# Patient Record
Sex: Female | Born: 1940
Health system: Southern US, Community
[De-identification: ages and names within clinical notes are randomized; demographics above are authoritative.]

## PROBLEM LIST (undated history)

## (undated) DIAGNOSIS — I498 Other specified cardiac arrhythmias: Secondary | ICD-10-CM

## (undated) DIAGNOSIS — Z87898 Personal history of other specified conditions: Secondary | ICD-10-CM

## (undated) DIAGNOSIS — M8580 Other specified disorders of bone density and structure, unspecified site: Secondary | ICD-10-CM

## (undated) DIAGNOSIS — Z8679 Personal history of other diseases of the circulatory system: Secondary | ICD-10-CM

## (undated) DIAGNOSIS — I499 Cardiac arrhythmia, unspecified: Secondary | ICD-10-CM

## (undated) DIAGNOSIS — E039 Hypothyroidism, unspecified: Secondary | ICD-10-CM

## (undated) DIAGNOSIS — R0989 Other specified symptoms and signs involving the circulatory and respiratory systems: Secondary | ICD-10-CM

## (undated) DIAGNOSIS — Z923 Personal history of irradiation: Secondary | ICD-10-CM

## (undated) DIAGNOSIS — I10 Essential (primary) hypertension: Secondary | ICD-10-CM

## (undated) DIAGNOSIS — C50919 Malignant neoplasm of unspecified site of unspecified female breast: Secondary | ICD-10-CM

## (undated) DIAGNOSIS — I341 Nonrheumatic mitral (valve) prolapse: Secondary | ICD-10-CM

## (undated) HISTORY — DX: Personal history of other diseases of the circulatory system: Z86.79

## (undated) HISTORY — DX: Essential (primary) hypertension: I10

## (undated) HISTORY — PX: HYSTEROSCOPY: SHX211

## (undated) HISTORY — DX: Other specified symptoms and signs involving the circulatory and respiratory systems: R09.89

## (undated) HISTORY — PX: OTHER SURGICAL HISTORY: SHX169

## (undated) HISTORY — DX: Hypothyroidism, unspecified: E03.9

## (undated) HISTORY — DX: Personal history of other specified conditions: Z87.898

## (undated) HISTORY — DX: Other specified cardiac arrhythmias: I49.8

## (undated) HISTORY — DX: Malignant neoplasm of unspecified site of unspecified female breast: C50.919

## (undated) HISTORY — DX: Nonrheumatic mitral (valve) prolapse: I34.1

## (undated) HISTORY — PX: EYE SURGERY: SHX253

## (undated) HISTORY — DX: Other specified disorders of bone density and structure, unspecified site: M85.80

---

## 1966-04-20 HISTORY — PX: PILONIDAL CYST / SINUS EXCISION: SUR543

## 1971-04-21 HISTORY — PX: HEMORROIDECTOMY: SUR656

## 1997-08-21 ENCOUNTER — Other Ambulatory Visit: Admission: RE | Admit: 1997-08-21 | Discharge: 1997-08-21 | Payer: Self-pay | Admitting: *Deleted

## 1997-08-31 ENCOUNTER — Ambulatory Visit (HOSPITAL_COMMUNITY): Admission: RE | Admit: 1997-08-31 | Discharge: 1997-08-31 | Payer: Self-pay | Admitting: Orthopedic Surgery

## 1997-11-14 ENCOUNTER — Other Ambulatory Visit: Admission: RE | Admit: 1997-11-14 | Discharge: 1997-11-14 | Payer: Self-pay | Admitting: *Deleted

## 1998-09-18 ENCOUNTER — Other Ambulatory Visit: Admission: RE | Admit: 1998-09-18 | Discharge: 1998-09-18 | Payer: Self-pay | Admitting: *Deleted

## 1999-04-21 HISTORY — PX: EXCISION MORTON'S NEUROMA: SHX5013

## 1999-04-30 ENCOUNTER — Other Ambulatory Visit: Admission: RE | Admit: 1999-04-30 | Discharge: 1999-04-30 | Payer: Self-pay | Admitting: Endocrinology

## 1999-09-29 ENCOUNTER — Other Ambulatory Visit: Admission: RE | Admit: 1999-09-29 | Discharge: 1999-09-29 | Payer: Self-pay | Admitting: *Deleted

## 1999-12-23 ENCOUNTER — Other Ambulatory Visit: Admission: RE | Admit: 1999-12-23 | Discharge: 1999-12-23 | Payer: Self-pay | Admitting: Podiatry

## 2000-04-20 HISTORY — PX: NEUROPLASTY / TRANSPOSITION MEDIAN NERVE AT CARPAL TUNNEL BILATERAL: SUR894

## 2000-06-25 ENCOUNTER — Encounter (INDEPENDENT_AMBULATORY_CARE_PROVIDER_SITE_OTHER): Payer: Self-pay

## 2000-06-25 ENCOUNTER — Other Ambulatory Visit: Admission: RE | Admit: 2000-06-25 | Discharge: 2000-06-25 | Payer: Self-pay | Admitting: *Deleted

## 2000-11-15 ENCOUNTER — Other Ambulatory Visit: Admission: RE | Admit: 2000-11-15 | Discharge: 2000-11-15 | Payer: Self-pay | Admitting: *Deleted

## 2001-10-26 ENCOUNTER — Other Ambulatory Visit: Admission: RE | Admit: 2001-10-26 | Discharge: 2001-10-26 | Payer: Self-pay | Admitting: *Deleted

## 2002-04-20 HISTORY — PX: THYROID LOBECTOMY: SHX420

## 2002-06-02 ENCOUNTER — Ambulatory Visit (HOSPITAL_BASED_OUTPATIENT_CLINIC_OR_DEPARTMENT_OTHER): Admission: RE | Admit: 2002-06-02 | Discharge: 2002-06-02 | Payer: Self-pay | Admitting: Orthopedic Surgery

## 2002-06-28 ENCOUNTER — Ambulatory Visit (HOSPITAL_BASED_OUTPATIENT_CLINIC_OR_DEPARTMENT_OTHER): Admission: RE | Admit: 2002-06-28 | Discharge: 2002-06-28 | Payer: Self-pay | Admitting: Orthopedic Surgery

## 2002-11-13 ENCOUNTER — Other Ambulatory Visit: Admission: RE | Admit: 2002-11-13 | Discharge: 2002-11-13 | Payer: Self-pay | Admitting: *Deleted

## 2003-01-01 ENCOUNTER — Other Ambulatory Visit: Admission: RE | Admit: 2003-01-01 | Discharge: 2003-01-01 | Payer: Self-pay | Admitting: Endocrinology

## 2003-03-06 ENCOUNTER — Encounter (INDEPENDENT_AMBULATORY_CARE_PROVIDER_SITE_OTHER): Payer: Self-pay

## 2003-03-06 ENCOUNTER — Inpatient Hospital Stay (HOSPITAL_COMMUNITY): Admission: RE | Admit: 2003-03-06 | Discharge: 2003-03-07 | Payer: Self-pay | Admitting: Surgery

## 2003-04-21 DIAGNOSIS — C50919 Malignant neoplasm of unspecified site of unspecified female breast: Secondary | ICD-10-CM

## 2003-04-21 HISTORY — DX: Malignant neoplasm of unspecified site of unspecified female breast: C50.919

## 2003-04-21 HISTORY — PX: BREAST LUMPECTOMY: SHX2

## 2003-04-21 HISTORY — PX: GANGLION CYST EXCISION: SHX1691

## 2003-12-17 ENCOUNTER — Other Ambulatory Visit: Admission: RE | Admit: 2003-12-17 | Discharge: 2003-12-17 | Payer: Self-pay | Admitting: *Deleted

## 2004-02-22 ENCOUNTER — Other Ambulatory Visit: Admission: RE | Admit: 2004-02-22 | Discharge: 2004-02-22 | Payer: Self-pay | Admitting: Surgery

## 2004-02-29 ENCOUNTER — Ambulatory Visit: Payer: Self-pay | Admitting: Oncology

## 2004-03-06 ENCOUNTER — Ambulatory Visit (HOSPITAL_COMMUNITY): Admission: RE | Admit: 2004-03-06 | Discharge: 2004-03-06 | Payer: Self-pay | Admitting: Orthopedic Surgery

## 2004-03-06 ENCOUNTER — Ambulatory Visit (HOSPITAL_BASED_OUTPATIENT_CLINIC_OR_DEPARTMENT_OTHER): Admission: RE | Admit: 2004-03-06 | Discharge: 2004-03-06 | Payer: Self-pay | Admitting: Orthopedic Surgery

## 2004-03-06 ENCOUNTER — Encounter (INDEPENDENT_AMBULATORY_CARE_PROVIDER_SITE_OTHER): Payer: Self-pay | Admitting: Specialist

## 2004-03-19 ENCOUNTER — Encounter (INDEPENDENT_AMBULATORY_CARE_PROVIDER_SITE_OTHER): Payer: Self-pay | Admitting: Specialist

## 2004-03-19 ENCOUNTER — Encounter (INDEPENDENT_AMBULATORY_CARE_PROVIDER_SITE_OTHER): Payer: Self-pay | Admitting: Surgery

## 2004-03-19 ENCOUNTER — Ambulatory Visit (HOSPITAL_COMMUNITY): Admission: RE | Admit: 2004-03-19 | Discharge: 2004-03-19 | Payer: Self-pay | Admitting: Surgery

## 2004-03-19 ENCOUNTER — Ambulatory Visit (HOSPITAL_BASED_OUTPATIENT_CLINIC_OR_DEPARTMENT_OTHER): Admission: RE | Admit: 2004-03-19 | Discharge: 2004-03-19 | Payer: Self-pay | Admitting: Surgery

## 2004-03-31 ENCOUNTER — Ambulatory Visit (HOSPITAL_COMMUNITY): Admission: RE | Admit: 2004-03-31 | Discharge: 2004-03-31 | Payer: Self-pay | Admitting: Oncology

## 2004-04-02 ENCOUNTER — Ambulatory Visit (HOSPITAL_COMMUNITY): Admission: RE | Admit: 2004-04-02 | Discharge: 2004-04-02 | Payer: Self-pay | Admitting: Oncology

## 2004-04-23 ENCOUNTER — Encounter: Admission: RE | Admit: 2004-04-23 | Discharge: 2004-04-23 | Payer: Self-pay | Admitting: Surgery

## 2004-04-24 ENCOUNTER — Encounter: Admission: RE | Admit: 2004-04-24 | Discharge: 2004-04-24 | Payer: Self-pay | Admitting: Surgery

## 2004-05-01 ENCOUNTER — Ambulatory Visit: Payer: Self-pay | Admitting: Oncology

## 2004-05-06 ENCOUNTER — Ambulatory Visit (HOSPITAL_COMMUNITY): Admission: RE | Admit: 2004-05-06 | Discharge: 2004-05-06 | Payer: Self-pay | Admitting: Oncology

## 2004-05-12 ENCOUNTER — Ambulatory Visit: Admission: RE | Admit: 2004-05-12 | Discharge: 2004-07-25 | Payer: Self-pay | Admitting: Radiation Oncology

## 2004-06-25 ENCOUNTER — Ambulatory Visit: Payer: Self-pay | Admitting: Oncology

## 2004-08-06 ENCOUNTER — Ambulatory Visit: Admission: RE | Admit: 2004-08-06 | Discharge: 2004-08-06 | Payer: Self-pay | Admitting: Radiation Oncology

## 2004-09-16 ENCOUNTER — Ambulatory Visit: Payer: Self-pay | Admitting: Internal Medicine

## 2004-09-19 ENCOUNTER — Ambulatory Visit: Payer: Self-pay | Admitting: Oncology

## 2004-11-19 ENCOUNTER — Ambulatory Visit: Payer: Self-pay | Admitting: Oncology

## 2004-12-24 ENCOUNTER — Encounter: Admission: RE | Admit: 2004-12-24 | Discharge: 2004-12-24 | Payer: Self-pay | Admitting: Oncology

## 2005-04-01 ENCOUNTER — Ambulatory Visit: Payer: Self-pay | Admitting: Oncology

## 2005-05-20 ENCOUNTER — Other Ambulatory Visit: Admission: RE | Admit: 2005-05-20 | Discharge: 2005-05-20 | Payer: Self-pay | Admitting: *Deleted

## 2005-07-29 ENCOUNTER — Ambulatory Visit: Payer: Self-pay | Admitting: Oncology

## 2005-10-12 ENCOUNTER — Ambulatory Visit: Payer: Self-pay | Admitting: Internal Medicine

## 2005-10-19 ENCOUNTER — Ambulatory Visit: Payer: Self-pay | Admitting: Internal Medicine

## 2005-11-24 ENCOUNTER — Ambulatory Visit: Payer: Self-pay | Admitting: Oncology

## 2005-11-26 LAB — CANCER ANTIGEN 27.29: CA 27.29: 12 U/mL (ref 0–39)

## 2005-11-26 LAB — COMPREHENSIVE METABOLIC PANEL
ALT: 20 U/L (ref 0–40)
CO2: 30 mEq/L (ref 19–32)
Calcium: 8.7 mg/dL (ref 8.4–10.5)
Chloride: 104 mEq/L (ref 96–112)
Sodium: 139 mEq/L (ref 135–145)
Total Protein: 6.7 g/dL (ref 6.0–8.3)

## 2005-11-26 LAB — CBC WITH DIFFERENTIAL/PLATELET
BASO%: 0.5 % (ref 0.0–2.0)
HCT: 37.2 % (ref 34.8–46.6)
MCHC: 34.4 g/dL (ref 32.0–36.0)
MONO#: 0.4 10*3/uL (ref 0.1–0.9)
RBC: 4.14 10*6/uL (ref 3.70–5.32)
WBC: 5.3 10*3/uL (ref 3.9–10.0)
lymph#: 0.9 10*3/uL (ref 0.9–3.3)

## 2005-11-26 LAB — LACTATE DEHYDROGENASE: LDH: 149 U/L (ref 94–250)

## 2005-12-28 ENCOUNTER — Encounter: Payer: Self-pay | Admitting: Internal Medicine

## 2005-12-28 ENCOUNTER — Encounter: Admission: RE | Admit: 2005-12-28 | Discharge: 2005-12-28 | Payer: Self-pay | Admitting: Oncology

## 2006-04-27 ENCOUNTER — Encounter: Admission: RE | Admit: 2006-04-27 | Discharge: 2006-04-27 | Payer: Self-pay | Admitting: Oncology

## 2006-05-26 ENCOUNTER — Ambulatory Visit: Payer: Self-pay | Admitting: Oncology

## 2006-05-28 LAB — COMPREHENSIVE METABOLIC PANEL
ALT: 19 U/L (ref 0–35)
AST: 21 U/L (ref 0–37)
Alkaline Phosphatase: 71 U/L (ref 39–117)
BUN: 17 mg/dL (ref 6–23)
Calcium: 9.2 mg/dL (ref 8.4–10.5)
Creatinine, Ser: 0.66 mg/dL (ref 0.40–1.20)
Total Bilirubin: 0.7 mg/dL (ref 0.3–1.2)

## 2006-05-28 LAB — CBC WITH DIFFERENTIAL/PLATELET
BASO%: 0.7 % (ref 0.0–2.0)
Basophils Absolute: 0 10*3/uL (ref 0.0–0.1)
EOS%: 0.8 % (ref 0.0–7.0)
HCT: 38.2 % (ref 34.8–46.6)
HGB: 13.6 g/dL (ref 11.6–15.9)
MCH: 30.7 pg (ref 26.0–34.0)
MCHC: 35.6 g/dL (ref 32.0–36.0)
MCV: 86.3 fL (ref 81.0–101.0)
MONO%: 4 % (ref 0.0–13.0)
NEUT%: 75.6 % (ref 39.6–76.8)
lymph#: 1.1 10*3/uL (ref 0.9–3.3)

## 2006-08-09 ENCOUNTER — Ambulatory Visit: Payer: Self-pay | Admitting: Internal Medicine

## 2006-09-01 ENCOUNTER — Other Ambulatory Visit: Admission: RE | Admit: 2006-09-01 | Discharge: 2006-09-01 | Payer: Self-pay | Admitting: *Deleted

## 2006-11-23 ENCOUNTER — Ambulatory Visit: Payer: Self-pay | Admitting: Oncology

## 2006-11-26 LAB — CBC WITH DIFFERENTIAL/PLATELET
Basophils Absolute: 0 10*3/uL (ref 0.0–0.1)
EOS%: 1.2 % (ref 0.0–7.0)
HCT: 37.8 % (ref 34.8–46.6)
HGB: 13.3 g/dL (ref 11.6–15.9)
MCH: 31.1 pg (ref 26.0–34.0)
MCV: 88.1 fL (ref 81.0–101.0)
MONO%: 7.9 % (ref 0.0–13.0)
NEUT%: 63.4 % (ref 39.6–76.8)
Platelets: 270 10*3/uL (ref 145–400)

## 2006-11-29 ENCOUNTER — Encounter: Payer: Self-pay | Admitting: Internal Medicine

## 2006-11-30 LAB — VITAMIN D PNL(25-HYDRXY+1,25-DIHY)-BLD
Vit D, 1,25-Dihydroxy: 35 pg/mL (ref 6–62)
Vit D, 25-Hydroxy: 27 ng/mL (ref 20–57)

## 2006-11-30 LAB — COMPREHENSIVE METABOLIC PANEL
AST: 21 U/L (ref 0–37)
BUN: 18 mg/dL (ref 6–23)
Calcium: 9.6 mg/dL (ref 8.4–10.5)
Chloride: 100 mEq/L (ref 96–112)
Creatinine, Ser: 0.84 mg/dL (ref 0.40–1.20)

## 2006-12-08 ENCOUNTER — Encounter (INDEPENDENT_AMBULATORY_CARE_PROVIDER_SITE_OTHER): Payer: Self-pay

## 2006-12-27 ENCOUNTER — Encounter: Payer: Self-pay | Admitting: Internal Medicine

## 2006-12-31 ENCOUNTER — Encounter: Admission: RE | Admit: 2006-12-31 | Discharge: 2006-12-31 | Payer: Self-pay | Admitting: *Deleted

## 2007-01-18 ENCOUNTER — Ambulatory Visit: Payer: Self-pay | Admitting: Internal Medicine

## 2007-03-28 ENCOUNTER — Telehealth: Payer: Self-pay | Admitting: Internal Medicine

## 2007-04-11 ENCOUNTER — Encounter: Payer: Self-pay | Admitting: Internal Medicine

## 2007-06-23 ENCOUNTER — Ambulatory Visit: Payer: Self-pay | Admitting: Oncology

## 2007-06-27 LAB — CBC WITH DIFFERENTIAL/PLATELET
Basophils Absolute: 0 10*3/uL (ref 0.0–0.1)
Eosinophils Absolute: 0 10*3/uL (ref 0.0–0.5)
HGB: 12.8 g/dL (ref 11.6–15.9)
MONO#: 0.2 10*3/uL (ref 0.1–0.9)
NEUT#: 6.4 10*3/uL (ref 1.5–6.5)
RBC: 4.19 10*6/uL (ref 3.70–5.32)
RDW: 13.4 % (ref 11.3–14.5)
WBC: 7.5 10*3/uL (ref 3.9–10.0)

## 2007-06-28 LAB — VITAMIN D 25 HYDROXY (VIT D DEFICIENCY, FRACTURES): Vit D, 25-Hydroxy: 41 ng/mL (ref 30–89)

## 2007-06-28 LAB — LACTATE DEHYDROGENASE: LDH: 182 U/L (ref 94–250)

## 2007-06-28 LAB — COMPREHENSIVE METABOLIC PANEL
Albumin: 3.8 g/dL (ref 3.5–5.2)
BUN: 17 mg/dL (ref 6–23)
CO2: 27 mEq/L (ref 19–32)
Calcium: 9 mg/dL (ref 8.4–10.5)
Chloride: 104 mEq/L (ref 96–112)
Glucose, Bld: 113 mg/dL — ABNORMAL HIGH (ref 70–99)
Potassium: 4.1 mEq/L (ref 3.5–5.3)
Sodium: 140 mEq/L (ref 135–145)
Total Protein: 6.6 g/dL (ref 6.0–8.3)

## 2007-06-30 LAB — VITAMIN D 1,25 DIHYDROXY: Vit D, 1,25-Dihydroxy: 44 pg/mL (ref 6–62)

## 2007-09-27 ENCOUNTER — Ambulatory Visit: Payer: Self-pay | Admitting: Internal Medicine

## 2007-09-27 DIAGNOSIS — I10 Essential (primary) hypertension: Secondary | ICD-10-CM | POA: Insufficient documentation

## 2007-09-27 DIAGNOSIS — E039 Hypothyroidism, unspecified: Secondary | ICD-10-CM | POA: Insufficient documentation

## 2007-09-27 DIAGNOSIS — J069 Acute upper respiratory infection, unspecified: Secondary | ICD-10-CM | POA: Insufficient documentation

## 2007-10-13 ENCOUNTER — Telehealth: Payer: Self-pay | Admitting: Internal Medicine

## 2007-10-13 ENCOUNTER — Ambulatory Visit: Payer: Self-pay | Admitting: Internal Medicine

## 2007-10-13 DIAGNOSIS — S90129A Contusion of unspecified lesser toe(s) without damage to nail, initial encounter: Secondary | ICD-10-CM | POA: Insufficient documentation

## 2007-11-22 ENCOUNTER — Ambulatory Visit: Payer: Self-pay | Admitting: Oncology

## 2007-11-24 LAB — CBC WITH DIFFERENTIAL/PLATELET
BASO%: 0.4 % (ref 0.0–2.0)
Basophils Absolute: 0 10*3/uL (ref 0.0–0.1)
HCT: 39 % (ref 34.8–46.6)
HGB: 13.4 g/dL (ref 11.6–15.9)
MONO#: 0.2 10*3/uL (ref 0.1–0.9)
NEUT#: 3.6 10*3/uL (ref 1.5–6.5)
NEUT%: 76 % (ref 39.6–76.8)
WBC: 4.7 10*3/uL (ref 3.9–10.0)
lymph#: 0.8 10*3/uL — ABNORMAL LOW (ref 0.9–3.3)

## 2007-11-25 LAB — COMPREHENSIVE METABOLIC PANEL
ALT: 18 U/L (ref 0–35)
BUN: 18 mg/dL (ref 6–23)
CO2: 28 mEq/L (ref 19–32)
Calcium: 9.6 mg/dL (ref 8.4–10.5)
Chloride: 100 mEq/L (ref 96–112)
Creatinine, Ser: 0.8 mg/dL (ref 0.40–1.20)

## 2007-11-25 LAB — LACTATE DEHYDROGENASE: LDH: 170 U/L (ref 94–250)

## 2007-11-25 LAB — CANCER ANTIGEN 27.29: CA 27.29: 17 U/mL (ref 0–39)

## 2008-01-04 ENCOUNTER — Encounter: Admission: RE | Admit: 2008-01-04 | Discharge: 2008-01-04 | Payer: Self-pay | Admitting: Oncology

## 2008-01-04 LAB — CBC WITH DIFFERENTIAL/PLATELET
Eosinophils Absolute: 0.1 10*3/uL (ref 0.0–0.5)
HCT: 38.4 % (ref 34.8–46.6)
LYMPH%: 16.2 % (ref 14.0–48.0)
MCHC: 34.3 g/dL (ref 32.0–36.0)
MCV: 90.5 fL (ref 81.0–101.0)
MONO#: 0.4 10*3/uL (ref 0.1–0.9)
NEUT#: 4.4 10*3/uL (ref 1.5–6.5)
NEUT%: 74.8 % (ref 39.6–76.8)
Platelets: 246 10*3/uL (ref 145–400)
WBC: 5.9 10*3/uL (ref 3.9–10.0)

## 2008-01-04 LAB — COMPREHENSIVE METABOLIC PANEL
BUN: 18 mg/dL (ref 6–23)
CO2: 32 mEq/L (ref 19–32)
Calcium: 8.9 mg/dL (ref 8.4–10.5)
Chloride: 103 mEq/L (ref 96–112)
Creatinine, Ser: 0.62 mg/dL (ref 0.40–1.20)

## 2008-01-06 ENCOUNTER — Encounter: Admission: RE | Admit: 2008-01-06 | Discharge: 2008-01-06 | Payer: Self-pay | Admitting: Oncology

## 2008-01-25 ENCOUNTER — Ambulatory Visit: Payer: Self-pay | Admitting: Internal Medicine

## 2008-01-25 DIAGNOSIS — M25519 Pain in unspecified shoulder: Secondary | ICD-10-CM | POA: Insufficient documentation

## 2008-04-23 ENCOUNTER — Telehealth: Payer: Self-pay | Admitting: Internal Medicine

## 2008-05-29 ENCOUNTER — Ambulatory Visit: Payer: Self-pay | Admitting: Oncology

## 2008-06-05 ENCOUNTER — Ambulatory Visit: Payer: Self-pay | Admitting: Internal Medicine

## 2008-06-06 ENCOUNTER — Telehealth: Payer: Self-pay | Admitting: Internal Medicine

## 2008-10-18 ENCOUNTER — Telehealth: Payer: Self-pay | Admitting: Internal Medicine

## 2008-10-18 ENCOUNTER — Ambulatory Visit: Payer: Self-pay | Admitting: Internal Medicine

## 2009-01-31 ENCOUNTER — Ambulatory Visit: Payer: Self-pay | Admitting: Oncology

## 2009-02-04 LAB — CBC WITH DIFFERENTIAL/PLATELET
Basophils Absolute: 0 10*3/uL (ref 0.0–0.1)
HCT: 37.9 % (ref 34.8–46.6)
HGB: 13 g/dL (ref 11.6–15.9)
MCH: 31.4 pg (ref 25.1–34.0)
MONO#: 0.3 10*3/uL (ref 0.1–0.9)
NEUT%: 81.2 % — ABNORMAL HIGH (ref 38.4–76.8)
Platelets: 242 10*3/uL (ref 145–400)
WBC: 7.3 10*3/uL (ref 3.9–10.3)
lymph#: 1 10*3/uL (ref 0.9–3.3)

## 2009-02-05 LAB — COMPREHENSIVE METABOLIC PANEL
BUN: 17 mg/dL (ref 6–23)
CO2: 28 mEq/L (ref 19–32)
Calcium: 9 mg/dL (ref 8.4–10.5)
Chloride: 102 mEq/L (ref 96–112)
Creatinine, Ser: 0.71 mg/dL (ref 0.40–1.20)

## 2009-02-05 LAB — LACTATE DEHYDROGENASE: LDH: 166 U/L (ref 94–250)

## 2009-02-05 LAB — CANCER ANTIGEN 27.29: CA 27.29: 15 U/mL (ref 0–39)

## 2009-02-05 LAB — VITAMIN D 25 HYDROXY (VIT D DEFICIENCY, FRACTURES): Vit D, 25-Hydroxy: 39 ng/mL (ref 30–89)

## 2009-02-21 ENCOUNTER — Encounter: Admission: RE | Admit: 2009-02-21 | Discharge: 2009-02-21 | Payer: Self-pay | Admitting: Oncology

## 2009-10-14 ENCOUNTER — Ambulatory Visit: Payer: Self-pay | Admitting: Internal Medicine

## 2009-10-14 DIAGNOSIS — M549 Dorsalgia, unspecified: Secondary | ICD-10-CM | POA: Insufficient documentation

## 2009-11-15 ENCOUNTER — Encounter: Payer: Self-pay | Admitting: Internal Medicine

## 2010-02-04 ENCOUNTER — Ambulatory Visit: Payer: Self-pay | Admitting: Oncology

## 2010-02-26 LAB — CBC WITH DIFFERENTIAL/PLATELET
BASO%: 0.3 % (ref 0.0–2.0)
Basophils Absolute: 0 10*3/uL (ref 0.0–0.1)
EOS%: 1.1 % (ref 0.0–7.0)
Eosinophils Absolute: 0.1 10*3/uL (ref 0.0–0.5)
HCT: 39.2 % (ref 34.8–46.6)
HGB: 13.4 g/dL (ref 11.6–15.9)
LYMPH%: 14.7 % (ref 14.0–49.7)
MCH: 30.6 pg (ref 25.1–34.0)
MCHC: 34.3 g/dL (ref 31.5–36.0)
MCV: 89.3 fL (ref 79.5–101.0)
MONO#: 0.4 10*3/uL (ref 0.1–0.9)
MONO%: 5.7 % (ref 0.0–14.0)
NEUT#: 5.3 10*3/uL (ref 1.5–6.5)
NEUT%: 78.2 % — ABNORMAL HIGH (ref 38.4–76.8)
Platelets: 311 10*3/uL (ref 145–400)
RBC: 4.39 10*6/uL (ref 3.70–5.45)
RDW: 13.4 % (ref 11.2–14.5)
WBC: 6.7 10*3/uL (ref 3.9–10.3)
lymph#: 1 10*3/uL (ref 0.9–3.3)

## 2010-02-27 LAB — COMPREHENSIVE METABOLIC PANEL
ALT: 28 U/L (ref 0–35)
AST: 26 U/L (ref 0–37)
Albumin: 4.1 g/dL (ref 3.5–5.2)
Alkaline Phosphatase: 84 U/L (ref 39–117)
BUN: 19 mg/dL (ref 6–23)
CO2: 27 mEq/L (ref 19–32)
Calcium: 9.4 mg/dL (ref 8.4–10.5)
Chloride: 99 mEq/L (ref 96–112)
Creatinine, Ser: 0.65 mg/dL (ref 0.40–1.20)
Glucose, Bld: 90 mg/dL (ref 70–99)
Potassium: 3.6 mEq/L (ref 3.5–5.3)
Sodium: 136 mEq/L (ref 135–145)
Total Bilirubin: 0.4 mg/dL (ref 0.3–1.2)
Total Protein: 7 g/dL (ref 6.0–8.3)

## 2010-02-27 LAB — VITAMIN D 25 HYDROXY (VIT D DEFICIENCY, FRACTURES): Vit D, 25-Hydroxy: 53 ng/mL (ref 30–89)

## 2010-02-27 LAB — CANCER ANTIGEN 27.29: CA 27.29: 16 U/mL (ref 0–39)

## 2010-02-27 LAB — LACTATE DEHYDROGENASE: LDH: 199 U/L (ref 94–250)

## 2010-03-06 ENCOUNTER — Encounter: Admission: RE | Admit: 2010-03-06 | Discharge: 2010-03-06 | Payer: Self-pay | Admitting: Oncology

## 2010-04-07 ENCOUNTER — Ambulatory Visit: Payer: Self-pay | Admitting: Internal Medicine

## 2010-04-07 DIAGNOSIS — M25569 Pain in unspecified knee: Secondary | ICD-10-CM | POA: Insufficient documentation

## 2010-04-20 HISTORY — PX: HAMMER TOE SURGERY: SHX385

## 2010-05-12 ENCOUNTER — Encounter: Payer: Self-pay | Admitting: Oncology

## 2010-05-20 NOTE — Assessment & Plan Note (Signed)
Summary: low back pain/nn   Vital Signs:  Patient profile:   70 year old female Weight:      148 pounds Temp:     98.0 degrees F oral BP sitting:   108 / 68  (left arm) Cuff size:   regular  Vitals Entered By: Duard Brady LPN (October 14, 2009 4:08 PM) CC: c/o low back pain (L) - see chiropractor wkly Is Patient Diabetic? No   CC:  c/o low back pain (L) - see chiropractor wkly.  History of Present Illness: 70 year old patient, who presents with a several week history of low back pain.  She has a  chronic history and has a relationship for a chiropractic treatments on a weekly basis.  For the past few weeks.  She has had increasing left  lumbar pain without radicular symptoms.  She does exercises regularly usually on a daily basis  Allergies: 1)  ! Sorbitol (Sorbitol)  Past History:  Past Medical History: Reviewed history from 09/27/2007 and no changes required. Hypertension Hypothyroidism history of breast cancer T1c, N0, invasive ductal cancer 2005 history of multi-nodular goiter  Physical Exam  General:  Well-developed,well-nourished,in no acute distress; alert,appropriate and cooperative throughout examination Msk:  straight leg testing negative; range of motion normal reflexes and motor exam of the lower extremities.  Normal   Impression & Recommendations:  Problem # 1:  BACK PAIN (ICD-724.5)  The following medications were removed from the medication list:    Cyclobenzaprine Hcl 5 Mg Tabs (Cyclobenzaprine hcl) ..... One at bedtime as needed for spasm  Problem # 2:  HYPERTENSION (ICD-401.9)  Her updated medication list for this problem includes:    Hydrochlorothiazide 25 Mg Tabs (Hydrochlorothiazide) .Marland Kitchen... 1 once daily  Complete Medication List: 1)  Synthroid 75 Mcg Tabs (Levothyroxine sodium) .Marland Kitchen.. 1 once daily 2)  Hydrochlorothiazide 25 Mg Tabs (Hydrochlorothiazide) .Marland Kitchen.. 1 once daily 3)  Allegra 180 Mg Tabs (Fexofenadine hcl) .... One daily  Patient  Instructions: 1)  Take 400-600mg  of Ibuprofen (Advil, Motrin) with food every 4-6 hours as needed for relief of pain or comfort of fever. 2)  Most patients (90%) with low back pain will improve with time (2-6 weeks). Keep active but avoid activities that are painful. Apply moist heat and/or ice to lower back several times a day.

## 2010-05-20 NOTE — Letter (Signed)
Summary: [facility]  [facility]   Imported By: Maryln Gottron 12/11/2009 14:19:24  _____________________________________________________________________  External Attachment:    Type:   Image     Comment:   External Document

## 2010-05-22 NOTE — Assessment & Plan Note (Signed)
Summary: knee trauma/dm   Vital Signs:  Patient profile:   70 year old female Weight:      155 pounds Temp:     97.8 degrees F oral BP sitting:   118 / 80  (left arm) Cuff size:   regular  Vitals Entered By: Duard Brady LPN (April 07, 2010 1:45 PM) CC: c/o (R) knee pain - mis-stepped off a step and twisted /fell on knee Is Patient Diabetic? No   CC:  c/o (R) knee pain - mis-stepped off a step and twisted /fell on knee.  History of Present Illness: 70 -year-old patient who is in approximate 36 hours after a fall resulted in trauma to the right knee.  She tripped and landed on her right knee and has had ongoing pain.  She has noticed some puffiness of the right knee, but pain has improved slightly today.  She has been using Aleve.  She is forced to walk with a limb  Allergies: 1)  ! Sorbitol (Sorbitol)  Review of Systems       The patient complains of difficulty walking.  The patient denies anorexia, fever, weight loss, weight gain, vision loss, decreased hearing, hoarseness, chest pain, syncope, dyspnea on exertion, peripheral edema, prolonged cough, headaches, hemoptysis, abdominal pain, melena, hematochezia, severe indigestion/heartburn, hematuria, incontinence, genital sores, muscle weakness, suspicious skin lesions, depression, unusual weight change, abnormal bleeding, enlarged lymph nodes, angioedema, and breast masses.    Physical Exam  General:  overweight-appearing.  blood pressureoverweight-appearing.   Msk:  mild effusion involving the right knee, especially laterally.  No focal tenderness.  Unable to fully extend the knee   Impression & Recommendations:  Problem # 1:  KNEE PAIN, RIGHT, ACUTE (ICD-719.46) patient has a traumatic right knee effusion.  She is aware of the possibility of meniscal tear.  Will continue Aleve b.i.d. sense  she is improving; if the right knee pain or effusion persists, will set up for orthopedic referral  Complete Medication  List: 1)  Synthroid 75 Mcg Tabs (Levothyroxine sodium) .Marland Kitchen.. 1 once daily 2)  Hydrochlorothiazide 25 Mg Tabs (Hydrochlorothiazide) .Marland Kitchen.. 1 once daily 3)  Allegra 180 Mg Tabs (Fexofenadine hcl) .... One daily  Other Orders: Flu Vaccine 40yrs + [phone]) Admin 1st Vaccine (60454)  Patient Instructions: 1)  call for orthopedic referral if right knee pain persists   Orders Added: 1)  Flu Vaccine 51yrs + [90658] 2)  Admin 1st Vaccine [90471] 3)  Est. Patient Level III [09811]   Immunizations Administered:  Influenza Vaccine # 1:    Vaccine Type: Fluvax 3+    Site: right deltoid    Mfr: GlaxoSmithKline    Dose: 0.5 ml    Route: IM    Given by: Duard Brady LPN    Exp. Date: 10/18/2010    Lot #: [id number]    VIS given: 11/12/09 version given April 07, 2010.    Physician counseled: yes  Flu Vaccine Consent Questions:    Do you have a history of severe allergic reactions to this vaccine? no    Any prior history of allergic reactions to egg and/or gelatin? no    Do you have a sensitivity to the preservative Thimersol? no    Do you have a past history of Guillan-Barre Syndrome? no    Do you currently have an acute febrile illness? no    Have you ever had a severe reaction to latex? no    Vaccine information given and explained to patient? yes    Are  you currently pregnant? no   Immunizations Administered:  Influenza Vaccine # 1:    Vaccine Type: Fluvax 3+    Site: right deltoid    Mfr: GlaxoSmithKline    Dose: 0.5 ml    Route: IM    Given by: Duard Brady LPN    Exp. Date: 10/18/2010    Lot #: [id number]    VIS given: 11/12/09 version given April 07, 2010.    Physician counseled: yes

## 2010-09-05 NOTE — Op Note (Signed)
NAME:  [name], [name] NO.:  [id number]   MEDICAL RECORD NO.:  [id number]          PATIENT TYPE:  AMB   LOCATION:  DSC                          FACILITY:  [facility]   PHYSICIAN:  Thornton Park. Daphine Deutscher, MD  DATE OF BIRTH:  01/29/1941   DATE OF PROCEDURE:  03/19/2004  DATE OF DISCHARGE:                                 OPERATIVE REPORT   PREOPERATIVE DIAGNOSIS:  Right breast cancer.   POSTOPERATIVE DIAGNOSIS:  Right breast cancer.   PROCEDURE:  Right axillary mapping and sentinel lymph node biopsy (4 nodes  touch prep negative).  Right breast quadrantectomy.   SURGEON:  Thornton Park. Daphine Deutscher, M.D.   ANESTHESIA:  General.   INDICATIONS FOR PROCEDURE:  Tiffany Reed is a 70 year old lady brought to  the OR with a needle aspirate diagnosis of malignancy.  Since she has  stopped her hormone replacement therapy, the mass has actually regressed  somewhat.  Mapping had already been done by injecting technetium sulfa  colloid, and I found a hot spot in the axilla and marked that.   DESCRIPTION OF PROCEDURE:  Breast and axilla were prepped with Betadine and  draped sterilely.  A small incision was made in the axilla, through which I  gained entrance and went right down on the hot area.  I removed a blue, hot  node, having injected 2 cc of Lymphazurin blue in the retroareolar region.  This was hot and blue.  There was residual activity.  I went in and found 2  other small nodes, with some activity and then another hot node that was  deeper.  A total of 4 nodes were sent, and pathology subsequently came back  negative for tumor.   Next, I went up and made curvilinear incision overlying the palpable mass,  and created flaps going toward the axilla and toward the nipple.  I then  felt that I could go around this, and went medial and lateral, went down to  the chest wall, took it off the muscle deep to where it was palpable, and  then came up near the nipple.  A generous quadrantectomy  had thus been  performed.  Bleeding was controlled with electrocautery.  After putting  markers on deep, superficial, medial and lateral areas, I sent it for touch  preps, and touch preps in the margin were negative.   The wound was irrigated with saline. It was closed with 4-0 Vicryl  subcutaneously and subcuticularly, and with Benzoin and Steri-Strips.  The  patient seemed to tolerate the procedure well, was taken to the recovery  room in satisfactory condition.   She will be given Percocet to take for pain, and will be followed in the  office in about 5-7 days.     Matt  MBM/MEDQ  D:  03/19/2004  T:  03/19/2004  Job:  [id number]

## 2010-09-08 ENCOUNTER — Other Ambulatory Visit: Payer: Self-pay | Admitting: Internal Medicine

## 2010-12-24 ENCOUNTER — Other Ambulatory Visit: Payer: Self-pay | Admitting: Dermatology

## 2010-12-26 ENCOUNTER — Ambulatory Visit (INDEPENDENT_AMBULATORY_CARE_PROVIDER_SITE_OTHER): Payer: Medicare Other | Admitting: Internal Medicine

## 2010-12-26 DIAGNOSIS — Z0000 Encounter for general adult medical examination without abnormal findings: Secondary | ICD-10-CM

## 2010-12-26 DIAGNOSIS — Z2911 Encounter for prophylactic immunotherapy for respiratory syncytial virus (RSV): Secondary | ICD-10-CM

## 2011-01-11 ENCOUNTER — Other Ambulatory Visit: Payer: Self-pay | Admitting: Internal Medicine

## 2011-01-27 ENCOUNTER — Ambulatory Visit (INDEPENDENT_AMBULATORY_CARE_PROVIDER_SITE_OTHER): Payer: Medicare Other | Admitting: Internal Medicine

## 2011-01-27 DIAGNOSIS — Z23 Encounter for immunization: Secondary | ICD-10-CM

## 2011-01-27 DIAGNOSIS — Z0000 Encounter for general adult medical examination without abnormal findings: Secondary | ICD-10-CM

## 2011-02-27 ENCOUNTER — Encounter: Payer: Self-pay | Admitting: Gastroenterology

## 2011-02-27 ENCOUNTER — Ambulatory Visit: Payer: Medicare Other | Admitting: Oncology

## 2011-03-18 ENCOUNTER — Ambulatory Visit (INDEPENDENT_AMBULATORY_CARE_PROVIDER_SITE_OTHER): Payer: Medicare Other

## 2011-03-18 ENCOUNTER — Telehealth: Payer: Self-pay | Admitting: Internal Medicine

## 2011-03-18 DIAGNOSIS — Z0000 Encounter for general adult medical examination without abnormal findings: Secondary | ICD-10-CM

## 2011-03-18 DIAGNOSIS — Z23 Encounter for immunization: Secondary | ICD-10-CM

## 2011-03-18 NOTE — Telephone Encounter (Signed)
Pt had her first Hep A/B in October. When would her next one be? Please return her call. Thanks.

## 2011-03-18 NOTE — Telephone Encounter (Signed)
Appointment has been made for today.  °

## 2011-03-18 NOTE — Telephone Encounter (Signed)
Spoke with pt- need to do now - schedule should be 0-30mos- 6mos for total of 3 injections.  Please put on my injections schedule for today pt aware

## 2011-06-03 ENCOUNTER — Other Ambulatory Visit: Payer: Self-pay | Admitting: Oncology

## 2011-06-03 ENCOUNTER — Encounter: Payer: Self-pay | Admitting: Gastroenterology

## 2011-06-03 DIAGNOSIS — Z9889 Other specified postprocedural states: Secondary | ICD-10-CM

## 2011-06-03 DIAGNOSIS — Z853 Personal history of malignant neoplasm of breast: Secondary | ICD-10-CM

## 2011-06-08 ENCOUNTER — Other Ambulatory Visit: Payer: Self-pay | Admitting: Internal Medicine

## 2011-06-09 ENCOUNTER — Ambulatory Visit (AMBULATORY_SURGERY_CENTER): Payer: Medicare Other | Admitting: *Deleted

## 2011-06-09 VITALS — Ht 63.0 in | Wt 149.3 lb

## 2011-06-09 DIAGNOSIS — Z1211 Encounter for screening for malignant neoplasm of colon: Secondary | ICD-10-CM | POA: Diagnosis not present

## 2011-06-09 MED ORDER — PEG-KCL-NACL-NASULF-NA ASC-C 100 G PO SOLR: ORAL | Status: DC

## 2011-06-15 ENCOUNTER — Ambulatory Visit
Admission: RE | Admit: 2011-06-15 | Discharge: 2011-06-15 | Disposition: A | Discharge status: Home / Self Care | Payer: Medicare Other | Source: Ambulatory Visit | Attending: Oncology | Admitting: Oncology

## 2011-06-15 DIAGNOSIS — Z9889 Other specified postprocedural states: Secondary | ICD-10-CM

## 2011-06-15 DIAGNOSIS — Z853 Personal history of malignant neoplasm of breast: Secondary | ICD-10-CM | POA: Diagnosis not present

## 2011-06-23 ENCOUNTER — Encounter: Payer: Self-pay | Admitting: Gastroenterology

## 2011-06-23 ENCOUNTER — Ambulatory Visit (AMBULATORY_SURGERY_CENTER): Payer: Medicare Other | Admitting: Gastroenterology

## 2011-06-23 VITALS — BP 115/61 | HR 64 | Temp 97.0°F | Resp 15 | Ht 63.0 in | Wt 149.0 lb

## 2011-06-23 DIAGNOSIS — Z1211 Encounter for screening for malignant neoplasm of colon: Secondary | ICD-10-CM

## 2011-06-23 DIAGNOSIS — I10 Essential (primary) hypertension: Secondary | ICD-10-CM | POA: Diagnosis not present

## 2011-06-23 DIAGNOSIS — E039 Hypothyroidism, unspecified: Secondary | ICD-10-CM | POA: Diagnosis not present

## 2011-06-23 MED ORDER — SODIUM CHLORIDE 0.9 % IV SOLN: 500.0000 mL | INTRAVENOUS | Status: DC

## 2011-06-23 NOTE — Patient Instructions (Signed)
YOU HAD AN ENDOSCOPIC PROCEDURE TODAY AT THE [facility]: Refer to the procedure report that was given to you for any specific questions about what was found during the examination.  If the procedure report does not answer your questions, please call your gastroenterologist to clarify.  If you requested that your care partner not be given the details of your procedure findings, then the procedure report has been included in a sealed envelope for you to review at your convenience later.  YOU SHOULD EXPECT: Some feelings of bloating in the abdomen. Passage of more gas than usual.  Walking can help get rid of the air that was put into your GI tract during the procedure and reduce the bloating. If you had a lower endoscopy (such as a colonoscopy or flexible sigmoidoscopy) you may notice spotting of blood in your stool or on the toilet paper. If you underwent a bowel prep for your procedure, then you may not have a normal bowel movement for a few days.  DIET: Your first meal following the procedure should be a light meal and then it is ok to progress to your normal diet.  A half-sandwich or bowl of soup is an example of a good first meal.  Heavy or fried foods are harder to digest and may make you feel nauseous or bloated.  Likewise meals heavy in dairy and vegetables can cause extra gas to form and this can also increase the bloating.  Drink plenty of fluids but you should avoid alcoholic beverages for 24 hours.  ACTIVITY: Your care partner should take you home directly after the procedure.  You should plan to take it easy, moving slowly for the rest of the day.  You can resume normal activity the day after the procedure however you should NOT DRIVE or use heavy machinery for 24 hours (because of the sedation medicines used during the test).    SYMPTOMS TO REPORT IMMEDIATELY: A gastroenterologist can be reached at any hour.  During normal business hours, 8:30 AM to 5:00 PM Monday through Friday,  call [phone].  After hours and on weekends, please call the GI answering service at [phone] who will take a message and have the physician on call contact you.   Following lower endoscopy (colonoscopy or flexible sigmoidoscopy):  Excessive amounts of blood in the stool  Significant tenderness or worsening of abdominal pains  Swelling of the abdomen that is new, acute  Fever of 100F or higher  Following upper endoscopy (EGD)  Vomiting of blood or coffee ground material  New chest pain or pain under the shoulder blades  Painful or persistently difficult swallowing  New shortness of breath  Fever of 100F or higher  Black, tarry-looking stools  FOLLOW UP: If any biopsies were taken you will be contacted by phone or by letter within the next 1-3 weeks.  Call your gastroenterologist if you have not heard about the biopsies in 3 weeks.  Our staff will call the home number listed on your records the next business day following your procedure to check on you and address any questions or concerns that you may have at that time regarding the information given to you following your procedure. This is a courtesy call and so if there is no answer at the home number and we have not heard from you through the emergency physician on call, we will assume that you have returned to your regular daily activities without incident.  SIGNATURES/CONFIDENTIALITY: You and/or your care   partner have signed paperwork which will be entered into your electronic medical record.  These signatures attest to the fact that that the information above on your After Visit Summary has been reviewed and is understood.  Full responsibility of the confidentiality of this discharge information lies with you and/or your care-partner.  

## 2011-06-23 NOTE — Progress Notes (Signed)
Patient did not have preoperative order for IV antibiotic SSI prophylaxis. [phone])  Patient did not experience any of the following events: a burn prior to discharge; a fall within the facility; wrong site/side/patient/procedure/implant event; or a hospital transfer or hospital admission upon discharge from the facility. [phone]) Patient with preoperative order for IV antibiotic SSI prophylaxis, antibiotic initiated on time. [phone])

## 2011-06-23 NOTE — Op Note (Signed)
[facility] [address] [place], [place]  [address]  COLONOSCOPY PROCEDURE REPORT  PATIENT:  Tiffany Reed, Tiffany Reed  MR#:  [id number] BIRTHDATE:  28-Apr-1940, 70 yrs. old  GENDER:  female ENDOSCOPIST:  Barbette Hair. Arlyce Dice, MD REF. BY: PROCEDURE DATE:  06/23/2011 PROCEDURE:  Diagnostic Colonoscopy ASA CLASS:  Class II INDICATIONS:  Routine Risk Screening MEDICATIONS:   MAC sedation, administered by CRNA propofol 130mg IV  DESCRIPTION OF PROCEDURE:   After the risks benefits and alternatives of the procedure were thoroughly explained, informed consent was obtained.  Digital rectal exam was performed and revealed no abnormalities.   The LB 180AL [id number] endoscope was introduced through the anus and advanced to the cecum, which was identified by both the appendix and ileocecal valve, without limitations.  The quality of the prep was excellent, using MoviPrep.  The instrument was then slowly withdrawn as the colon was fully examined. <<PROCEDUREIMAGES>>  FINDINGS:  A normal appearing cecum, ileocecal valve, and appendiceal orifice were identified. The ascending, hepatic flexure, transverse, splenic flexure, descending, sigmoid colon, and rectum appeared unremarkable (see image2, image4, image5, and image6).   Retroflexed views in the rectum revealed no abnormalities.    The time to cecum =  1) 5.75  minutes. The scope was then withdrawn in  1) 6.0  minutes from the cecum and the procedure completed. COMPLICATIONS:  None ENDOSCOPIC IMPRESSION: 1) Normal colon RECOMMENDATIONS: 1) Continue current colorectal screening recommendations for "routine risk" patients with a repeat colonoscopy in 10 years. REPEAT EXAM:  In 10 year(s) for Colonoscopy.  ______________________________ Barbette Hair. Arlyce Dice, MD  CC:  Gordy Savers, MD  n. Rosalie Doctor:   Barbette Hair. Danesha Kirchoff at 06/23/2011 01:58 PM  Conan Bowens, [phone]

## 2011-06-24 ENCOUNTER — Telehealth: Payer: Self-pay | Admitting: *Deleted

## 2011-06-24 NOTE — Telephone Encounter (Signed)
Message left on number given in admitting yesterday to return call if problems or questions. ewm

## 2011-07-05 ENCOUNTER — Other Ambulatory Visit: Payer: Self-pay | Admitting: Internal Medicine

## 2011-07-08 DIAGNOSIS — Z124 Encounter for screening for malignant neoplasm of cervix: Secondary | ICD-10-CM | POA: Diagnosis not present

## 2011-07-09 ENCOUNTER — Other Ambulatory Visit (HOSPITAL_COMMUNITY): Payer: Self-pay | Admitting: Obstetrics and Gynecology

## 2011-07-09 DIAGNOSIS — E041 Nontoxic single thyroid nodule: Secondary | ICD-10-CM

## 2011-07-14 ENCOUNTER — Other Ambulatory Visit (HOSPITAL_COMMUNITY): Payer: Medicare Other

## 2011-07-14 ENCOUNTER — Ambulatory Visit (HOSPITAL_COMMUNITY)
Admission: RE | Admit: 2011-07-14 | Discharge: 2011-07-14 | Disposition: A | Discharge status: Home / Self Care | Payer: Medicare Other | Source: Ambulatory Visit | Attending: Obstetrics and Gynecology | Admitting: Obstetrics and Gynecology

## 2011-07-14 DIAGNOSIS — E041 Nontoxic single thyroid nodule: Secondary | ICD-10-CM | POA: Diagnosis not present

## 2011-07-14 DIAGNOSIS — E042 Nontoxic multinodular goiter: Secondary | ICD-10-CM | POA: Diagnosis not present

## 2011-07-22 ENCOUNTER — Ambulatory Visit: Payer: Medicare Other | Admitting: Cardiovascular Disease

## 2011-07-27 ENCOUNTER — Ambulatory Visit: Payer: Medicare Other | Admitting: Cardiovascular Disease

## 2011-08-05 ENCOUNTER — Encounter: Payer: Self-pay | Admitting: Internal Medicine

## 2011-08-05 ENCOUNTER — Ambulatory Visit (INDEPENDENT_AMBULATORY_CARE_PROVIDER_SITE_OTHER): Payer: Medicare Other | Admitting: Internal Medicine

## 2011-08-05 ENCOUNTER — Encounter: Payer: Self-pay | Admitting: *Deleted

## 2011-08-05 VITALS — BP 130/68 | HR 63 | Resp 18 | Ht 63.0 in | Wt 142.0 lb

## 2011-08-05 DIAGNOSIS — R002 Palpitations: Secondary | ICD-10-CM | POA: Diagnosis not present

## 2011-08-05 DIAGNOSIS — R000 Tachycardia, unspecified: Secondary | ICD-10-CM | POA: Diagnosis not present

## 2011-08-05 NOTE — Patient Instructions (Signed)
Your physician has requested that you have an echocardiogram. Echocardiography is a painless test that uses sound waves to create images of your heart. It provides your doctor with information about the size and shape of your heart and how well your heart's chambers and valves are working. This procedure takes approximately one hour. There are no restrictions for this procedure.  Your physician has recommended that you wear a 30 day event monitor. Event monitors are medical devices that record the heart's electrical activity. Doctors most often [place] these monitors to diagnose arrhythmias. Arrhythmias are problems with the speed or rhythm of the heartbeat. The monitor is a small, portable device. You can wear one while you do your normal daily activities. This is usually used to diagnose what is causing palpitations/syncope (passing out).  Your physician recommends that you schedule a follow-up appointment in: 6 weeks with Dr. Johney Frame.

## 2011-08-09 ENCOUNTER — Other Ambulatory Visit: Payer: Self-pay | Admitting: Internal Medicine

## 2011-08-14 ENCOUNTER — Other Ambulatory Visit (HOSPITAL_COMMUNITY): Payer: Medicare Other

## 2011-08-17 ENCOUNTER — Encounter: Payer: Self-pay | Admitting: Internal Medicine

## 2011-08-17 DIAGNOSIS — R002 Palpitations: Secondary | ICD-10-CM | POA: Insufficient documentation

## 2011-08-17 NOTE — Assessment & Plan Note (Signed)
Likely PACs or PVCs.  I suspect that these are benign in etiology.  At this point, I will obtain an echo to evaluate for structural heart disease.  I will also place an event monitor to try and better characterize her palpitations.  Given their infrequent nature, I would not recommend additional medical therapy at this time.  If above workup is benign, then I will see her as needed going forward.  She will contact my office if problems arise.

## 2011-08-17 NOTE — Progress Notes (Signed)
Primary Care Physician: Rogelia Boga, MD, MD Referring Physician: Dr Marcene Brawn is a 71 y.o. female with a h/o palpitations who presents today for cardiology consultation.  She has a h/o breast cancer and is s/p lumpectomy with subsequent XRT and tamoxifen.  She has also had a L partial thyroidectomy for nodules.  She is referred today for further evaluation of palpitations.  She reports that over the past 2 years that she has noticed episodes of palpitations.  She describes a "fluttering" sensation, typically lasting only several seconds.  Episodes occur every few weeks.  She finds that stress will precipitates episodes.  She reports mild dizziness but denies symptoms of chest pain, shortness of breath, orthopnea, PND, lower extremity edema, presyncope, syncope, or neurologic sequela. The patient is tolerating medications without difficulties and is otherwise without complaint today.   Past Medical History  Diagnosis Date  . Breast cancer 2005    right, s/p xrt  . Hypertension   . Hypothyroidism   . Fluttering heart     and racing over the past several years with lightheadedness  . H/O urinary incontinence    Past Surgical History  Procedure Date  . Breast lumpectomy 2005    right  . Ganglion cyst excision 2005    left thumb  . Hysteroscopy [id number]  . Thyroid lobectomy 2004  . Neuroplasty / transposition median nerve at carpal tunnel bilateral 2002  . Plantar fibroma     4th toe, right foot  . Excision morton's neuroma 2001    left foot  . Hemorroidectomy 1973  . Pilonidal cyst / sinus excision 1968  . Hammer toe surgery 2012    right 5th toe    Current Outpatient Prescriptions  Medication Sig Dispense Refill  . hydrochlorothiazide (HYDRODIURIL) 25 MG tablet TAKE 1 TABLET BY MOUTH EVERY DAY  30 tablet  0  . hydrochlorothiazide (HYDRODIURIL) 25 MG tablet TAKE 1 TABLET BY MOUTH EVERY DAY  60 tablet  0  . naproxen sodium (ANAPROX) 550 MG tablet Take 550  mg by mouth 2 (two) times daily with a meal.       . SYNTHROID 75 MCG tablet TAKE 1 TABLET BY MOUTH EVERY DAY  60 tablet  0  . SYNTHROID 75 MCG tablet TAKE 1 TABLET BY MOUTH EVERY DAY  60 tablet  0    Allergies  Allergen Reactions  . Sorbitol     Gas, diarrhea  . Penicillins Rash    History   Social History  . Marital Status: Married    Spouse Name: N/A    Number of Children: N/A  . Years of Education: N/A   Occupational History  . Not on file.   Social History Main Topics  . Smoking status: Never Smoker   . Smokeless tobacco: Never Used  . Alcohol Use: Yes     rare  . Drug Use: No  . Sexually Active: Not on file   Other Topics Concern  . Not on file   Social History Narrative   Pt lives in [place].   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    Family History  Problem Relation Age of Onset  . Colon cancer Neg Hx   . Stomach cancer Neg Hx     ROS- All systems are reviewed and negative except as per the HPI above  Physical Exam: Filed Vitals:   08/05/11 1539  BP: 130/68  Pulse: 63  Resp: 18  Height: 5\' 3"  (1.6 m)  Weight: 142 lb (64.411 kg)    GEN- The patient is well appearing, alert and oriented x 3 today.   Head- normocephalic, atraumatic Eyes-  Sclera clear, conjunctiva pink Ears- hearing intact Oropharynx- clear Neck- supple, no JVP Lymph- no cervical lymphadenopathy Lungs- Clear to ausculation bilaterally, normal work of breathing Heart- Regular rate and rhythm, no murmurs, rubs or gallops, PMI not laterally displaced GI- soft, NT, ND, + BS Extremities- no clubbing, cyanosis, or edema MS- no significant deformity or atrophy Skin- no rash or lesion Psych- euthymic mood, full affect Neuro- strength and sensation are intact  EKG today reveals sinus rhythm 63 bpm, PR 188, RBBB  Assessment and Plan:

## 2011-08-18 ENCOUNTER — Ambulatory Visit (HOSPITAL_COMMUNITY): Payer: Medicare Other | Attending: Internal Medicine

## 2011-08-18 ENCOUNTER — Encounter (INDEPENDENT_AMBULATORY_CARE_PROVIDER_SITE_OTHER): Payer: Medicare Other

## 2011-08-18 ENCOUNTER — Other Ambulatory Visit: Payer: Self-pay

## 2011-08-18 DIAGNOSIS — C50919 Malignant neoplasm of unspecified site of unspecified female breast: Secondary | ICD-10-CM | POA: Insufficient documentation

## 2011-08-18 DIAGNOSIS — R000 Tachycardia, unspecified: Secondary | ICD-10-CM

## 2011-08-18 DIAGNOSIS — I059 Rheumatic mitral valve disease, unspecified: Secondary | ICD-10-CM

## 2011-08-19 ENCOUNTER — Telehealth: Payer: Self-pay | Admitting: Internal Medicine

## 2011-08-19 NOTE — Telephone Encounter (Signed)
Patient says Dr Johney Frame told her to stop her HCTZ because it may be contributing to her Palpitations.  I do not see this anywhere in the note.  However, she says she does not like to take medications so she is weaning herself off of the HCTZ and is going to keep a log of her BP's  If they continue to stay 140's/90 she will follow up with her PCP in regards to this.  She is still wearing a monitor and will start working out   She will keep log with this as well

## 2011-08-19 NOTE — Telephone Encounter (Signed)
Pt will be going out and needs a call as soon as you get a chance has questions regarding medications and the monitor she is wearing. Per pt the question about monitor are not questions Windell Moulding can answer.

## 2011-08-20 ENCOUNTER — Telehealth: Payer: Self-pay | Admitting: Internal Medicine

## 2011-08-20 NOTE — Telephone Encounter (Signed)
New Problem:     Patient called in needing the bottom number from her latest ECHO BP reading.  Please call back and feel free to leave a message.

## 2011-08-20 NOTE — Telephone Encounter (Signed)
Patient BP is better and she is going to continue to monitor and keep log

## 2011-08-25 DIAGNOSIS — L989 Disorder of the skin and subcutaneous tissue, unspecified: Secondary | ICD-10-CM | POA: Diagnosis not present

## 2011-09-16 ENCOUNTER — Encounter: Payer: Self-pay | Admitting: Internal Medicine

## 2011-09-16 ENCOUNTER — Ambulatory Visit (INDEPENDENT_AMBULATORY_CARE_PROVIDER_SITE_OTHER): Payer: Medicare Other | Admitting: Internal Medicine

## 2011-09-16 VITALS — BP 130/64 | HR 70 | Resp 18 | Ht 63.0 in | Wt 144.8 lb

## 2011-09-16 DIAGNOSIS — I10 Essential (primary) hypertension: Secondary | ICD-10-CM

## 2011-09-16 DIAGNOSIS — R002 Palpitations: Secondary | ICD-10-CM

## 2011-09-16 MED ORDER — HYDROCHLOROTHIAZIDE 12.5 MG PO TABS: 12.5000 mg | ORAL_TABLET | Freq: Every day | ORAL | Status: DC

## 2011-09-16 NOTE — Patient Instructions (Signed)
Your physician recommends that you schedule a follow-up appointment as needed  

## 2011-09-16 NOTE — Progress Notes (Signed)
PCP: Rogelia Boga, MD, MD  Tiffany Reed is a 71 y.o. female who presents today for routine electrophysiology followup.  Since last being seen in our clinic, the patient reports doing very well.  She tried stopping hctz but had to resume due to elevated BP (150s/90s).  With 25mg  daily, she has had some episodes of heart rates 90s/60s.  Today, she denies symptoms of palpitations, chest pain, shortness of breath,  lower extremity edema, dizziness, presyncope, or syncope.  The patient is otherwise without complaint today.   Past Medical History  Diagnosis Date  . Breast cancer 2005    right, s/p xrt  . Hypertension   . Hypothyroidism   . Fluttering heart     and racing over the past several years with lightheadedness  . H/O urinary incontinence    Past Surgical History  Procedure Date  . Breast lumpectomy 2005    right  . Ganglion cyst excision 2005    left thumb  . Hysteroscopy [id number]  . Thyroid lobectomy 2004  . Neuroplasty / transposition median nerve at carpal tunnel bilateral 2002  . Plantar fibroma     4th toe, right foot  . Excision morton's neuroma 2001    left foot  . Hemorroidectomy 1973  . Pilonidal cyst / sinus excision 1968  . Hammer toe surgery 2012    right 5th toe    Current Outpatient Prescriptions  Medication Sig Dispense Refill  . hydrochlorothiazide (HYDRODIURIL) 25 MG tablet TAKE 1 TABLET BY MOUTH EVERY DAY  30 tablet  0  . hydrochlorothiazide (HYDRODIURIL) 25 MG tablet TAKE 1 TABLET BY MOUTH EVERY DAY  60 tablet  0  . naproxen sodium (ANAPROX) 550 MG tablet Take 550 mg by mouth 2 (two) times daily with a meal.       . SYNTHROID 75 MCG tablet TAKE 1 TABLET BY MOUTH EVERY DAY  60 tablet  0  . SYNTHROID 75 MCG tablet TAKE 1 TABLET BY MOUTH EVERY DAY  60 tablet  0    Physical Exam: Filed Vitals:   09/16/11 1529  BP: 130/64  Pulse: 70  Resp: 18  Height: 5\' 3"  (1.6 m)  Weight: 144 lb 12.8 oz (65.681 kg)    GEN- The patient is well  appearing, alert and oriented x 3 today.   Head- normocephalic, atraumatic Eyes-  Sclera clear, conjunctiva pink Ears- hearing intact Oropharynx- clear Lungs- Clear to ausculation bilaterally, normal work of breathing Heart- Regular rate and rhythm, no murmurs, rubs or gallops, PMI not laterally displaced GI- soft, NT, ND, + BS Extremities- no clubbing, cyanosis, or edema  Event monitor reveals no arrhythmias Echo reveals preserved EF, mild MR  Assessment and Plan:

## 2011-09-16 NOTE — Assessment & Plan Note (Signed)
Decrease hctz to 12.5mg  daily Adequate hydration

## 2011-09-16 NOTE — Assessment & Plan Note (Signed)
Event monitor is reviewed and is normal Echo reveals no significant structural changes  No changes recommended

## 2011-10-28 DIAGNOSIS — D239 Other benign neoplasm of skin, unspecified: Secondary | ICD-10-CM | POA: Diagnosis not present

## 2011-11-12 ENCOUNTER — Other Ambulatory Visit: Payer: Self-pay | Admitting: Internal Medicine

## 2011-11-19 ENCOUNTER — Encounter: Payer: Medicare Other | Admitting: Internal Medicine

## 2011-12-09 ENCOUNTER — Telehealth: Payer: Self-pay | Admitting: Internal Medicine

## 2011-12-09 MED ORDER — SYNTHROID 75 MCG PO TABS: 75.0000 ug | ORAL_TABLET | Freq: Every day | ORAL | Status: DC

## 2011-12-09 NOTE — Telephone Encounter (Signed)
Spoke with husband - she has appt 12/25/11 and needs to keep - we will give 90 days rx's at that time - I will send 30 rx for TSH med - Dr. Johney Frame has already fill HCTZ for 89yr. KIK

## 2011-12-09 NOTE — Telephone Encounter (Signed)
Caller: [name]/Patient; Patient Name: Tiffany Reed; PCP: Eleonore Chiquito; Best Callback Phone Number: [phone].  Pt  RREQUESTING a SCRIPT FOR  SYNTHOID 75 MCG AND HCTZ 12.5MG .  Pt has a week's supply of medicines left and will run out of meds before her follow up appt on 12/25/11   Pt is asking for enough medication to be called in until she is seen or new scripts called in for a 90 day supply.   PT REQUESTING PRESCRIPTION FOR SYNTHROID AND HCTZ.  Thank you.

## 2011-12-25 ENCOUNTER — Ambulatory Visit (INDEPENDENT_AMBULATORY_CARE_PROVIDER_SITE_OTHER): Payer: Medicare Other | Admitting: Internal Medicine

## 2011-12-25 ENCOUNTER — Encounter: Payer: Self-pay | Admitting: Internal Medicine

## 2011-12-25 VITALS — BP 110/80 | HR 68 | Temp 97.7°F | Resp 18 | Ht 62.0 in | Wt 144.0 lb

## 2011-12-25 DIAGNOSIS — Z0000 Encounter for general adult medical examination without abnormal findings: Secondary | ICD-10-CM

## 2011-12-25 DIAGNOSIS — Z23 Encounter for immunization: Secondary | ICD-10-CM | POA: Diagnosis not present

## 2011-12-25 DIAGNOSIS — E039 Hypothyroidism, unspecified: Secondary | ICD-10-CM | POA: Diagnosis not present

## 2011-12-25 DIAGNOSIS — M8580 Other specified disorders of bone density and structure, unspecified site: Secondary | ICD-10-CM

## 2011-12-25 DIAGNOSIS — M949 Disorder of cartilage, unspecified: Secondary | ICD-10-CM | POA: Diagnosis not present

## 2011-12-25 DIAGNOSIS — E785 Hyperlipidemia, unspecified: Secondary | ICD-10-CM | POA: Diagnosis not present

## 2011-12-25 DIAGNOSIS — C50911 Malignant neoplasm of unspecified site of right female breast: Secondary | ICD-10-CM

## 2011-12-25 DIAGNOSIS — I10 Essential (primary) hypertension: Secondary | ICD-10-CM | POA: Diagnosis not present

## 2011-12-25 DIAGNOSIS — C50919 Malignant neoplasm of unspecified site of unspecified female breast: Secondary | ICD-10-CM

## 2011-12-25 DIAGNOSIS — M899 Disorder of bone, unspecified: Secondary | ICD-10-CM

## 2011-12-25 DIAGNOSIS — M810 Age-related osteoporosis without current pathological fracture: Secondary | ICD-10-CM | POA: Insufficient documentation

## 2011-12-25 LAB — CBC WITH DIFFERENTIAL/PLATELET
Eosinophils Absolute: 0 10*3/uL (ref 0.0–0.7)
Eosinophils Relative: 1 % (ref 0.0–5.0)
Lymphocytes Relative: 22.7 % (ref 12.0–46.0)
MCHC: 32.9 g/dL (ref 30.0–36.0)
MCV: 89.2 fl (ref 78.0–100.0)
Monocytes Absolute: 0.3 10*3/uL (ref 0.1–1.0)
Neutrophils Relative %: 68.9 % (ref 43.0–77.0)
Platelets: 245 10*3/uL (ref 150.0–400.0)
WBC: 4.8 10*3/uL (ref 4.5–10.5)

## 2011-12-25 MED ORDER — HYDROCHLOROTHIAZIDE 12.5 MG PO TABS: 12.5000 mg | ORAL_TABLET | Freq: Every day | ORAL | Status: DC

## 2011-12-25 MED ORDER — SYNTHROID 75 MCG PO TABS: 75.0000 ug | ORAL_TABLET | Freq: Every day | ORAL | Status: DC

## 2011-12-25 NOTE — Patient Instructions (Signed)
It is important that you exercise regularly, at least 20 minutes 3 to 4 times per week.  If you develop chest pain or shortness of breath seek  medical attention.  Take a calcium supplement, plus [id number] units of vitamin D  Return in one year for follow-up   DEXA scan

## 2011-12-25 NOTE — Progress Notes (Signed)
Subjective:    Patient ID: Tiffany Reed, female    DOB: [birth year], 71 y.o.   MRN: [id number]  HPI  71 year old patient who is seen today for a wellness exam. She has remote history of breast cancer status post resection in 2005. She has hypertension hypothyroidism and a history of osteopenia. She has seen cardiology recently due 2 palpitations. Evaluation included a 2-D echocardiogram. She also had [facility] screening is suggested that frank osteoporosis as well as mild carotid artery disease.  Past Medical History  Diagnosis Date  . Breast cancer 2005    right, s/p xrt  . Hypertension   . Hypothyroidism   . Fluttering heart     and racing over the past several years with lightheadedness  . H/O urinary incontinence     History   Social History  . Marital Status: Married    Spouse Name: N/A    Number of Children: N/A  . Years of Education: N/A   Occupational History  . Not on file.   Social History Main Topics  . Smoking status: Never Smoker   . Smokeless tobacco: Never Used  . Alcohol Use: Yes     rare  . Drug Use: No  . Sexually Active: Not on file   Other Topics Concern  . Not on file   Social History Narrative   Pt lives in [place].   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    Past Surgical History  Procedure Date  . Breast lumpectomy 2005    right  . Ganglion cyst excision 2005    left thumb  . Hysteroscopy [id number]  . Thyroid lobectomy 2004  . Neuroplasty / transposition median nerve at carpal tunnel bilateral 2002  . Plantar fibroma     4th toe, right foot  . Excision morton's neuroma 2001    left foot  . Hemorroidectomy 1973  . Pilonidal cyst / sinus excision 1968  . Hammer toe surgery 2012    right 5th toe    Family History  Problem Relation Age of Onset  . Colon cancer Neg Hx   . Stomach cancer Neg Hx     Allergies  Allergen Reactions  . Sorbitol     Gas, diarrhea  . Penicillins Rash    Current Outpatient  Prescriptions on File Prior to Visit  Medication Sig Dispense Refill  . hydrochlorothiazide (HYDRODIURIL) 12.5 MG tablet Take 1 tablet (12.5 mg total) by mouth daily.  30 tablet  11  . naproxen sodium (ANAPROX) 550 MG tablet Take 550 mg by mouth 2 (two) times daily with a meal.       . SYNTHROID 75 MCG tablet Take 1 tablet (75 mcg total) by mouth daily.  30 tablet  0    BP 110/80  Pulse 68  Temp 97.7 F (36.5 C) (Oral)  Resp 18  Ht 5\' 2"  (1.575 m)  Wt 144 lb (65.318 kg)  BMI 26.34 kg/m2   1. Risk factors, based on past  M,S,F history-  CV risk  Factors include HTN  2.  Physical activities: no restrictions  3.  Depression/mood: neg  4.  Hearing: neg  5.  ADL's: independent  6.  Fall risk: low  7.  Home safety: no problems  8.  Height weight, and visual acuity; h/o decr ht  9.  Counseling: exercise , vit D  10. Lab orders based on risk factors:  today  11. Referral : DEXA  12. Care  plan: exercise; consider osteporosis rx  13. Cognitive assessment:  Alert; appropriate       Review of Systems  Constitutional: Negative for fever, appetite change, fatigue and unexpected weight change.  HENT: Negative for hearing loss, ear pain, nosebleeds, congestion, sore throat, mouth sores, trouble swallowing, neck stiffness, dental problem, voice change, sinus pressure and tinnitus.   Eyes: Negative for photophobia, pain, redness and visual disturbance.  Respiratory: Negative for cough, chest tightness and shortness of breath.   Cardiovascular: Negative for chest pain, palpitations and leg swelling.  Gastrointestinal: Negative for nausea, vomiting, abdominal pain, diarrhea, constipation, blood in stool, abdominal distention and rectal pain.  Genitourinary: Negative for dysuria, urgency, frequency, hematuria, flank pain, vaginal bleeding, vaginal discharge, difficulty urinating, genital sores, vaginal pain, menstrual problem and pelvic pain.  Musculoskeletal: Negative for back  pain and arthralgias.  Skin: Negative for rash.  Neurological: Negative for dizziness, syncope, speech difficulty, weakness, light-headedness, numbness and headaches.  Hematological: Negative for adenopathy. Does not bruise/bleed easily.  Psychiatric/Behavioral: Negative for suicidal ideas, behavioral problems, self-injury, dysphoric mood and agitation. The patient is not nervous/anxious.        Objective:   Physical Exam  Constitutional: She is oriented to person, place, and time. She appears well-developed and well-nourished.  HENT:  Head: Normocephalic and atraumatic.  Right Ear: External ear normal.  Left Ear: External ear normal.  Mouth/Throat: Oropharynx is clear and moist.  Eyes: Conjunctivae and EOM are normal.  Neck: Normal range of motion. Neck supple. No JVD present. No thyromegaly present.  Cardiovascular: Normal rate, regular rhythm, normal heart sounds and intact distal pulses.   No murmur heard.      And pulses faint   Pulmonary/Chest: Effort normal and breath sounds normal. She has no wheezes. She has no rales.  Abdominal: Soft. Bowel sounds are normal. She exhibits no distension and no mass. There is no tenderness. There is no rebound and no guarding.  Genitourinary: Vagina normal.  Musculoskeletal: Normal range of motion. She exhibits no edema and no tenderness.  Neurological: She is alert and oriented to person, place, and time. She has normal reflexes. No cranial nerve deficit. She exhibits normal muscle tone. Coordination normal.  Skin: Skin is warm and dry. No rash noted.  Psychiatric: She has a normal mood and affect. Her behavior is normal.          Assessment & Plan:    Preventive health examination  Hypertension stable  Hypothyroidism   Will then followup DEXA scan  Laboratory studies including lipid profile will be reviewed   Please check your blood pressure on a regular basis.  If it is consistently greater than 150/90, please make an office  appointment.

## 2011-12-28 LAB — LDL CHOLESTEROL, DIRECT: Direct LDL: 125.5 mg/dL

## 2011-12-28 LAB — COMPREHENSIVE METABOLIC PANEL
Albumin: 4.1 g/dL (ref 3.5–5.2)
Alkaline Phosphatase: 71 U/L (ref 39–117)
Calcium: 9.7 mg/dL (ref 8.4–10.5)
Chloride: 104 mEq/L (ref 96–112)
Glucose, Bld: 78 mg/dL (ref 70–99)
Potassium: 3.6 mEq/L (ref 3.5–5.1)
Sodium: 142 mEq/L (ref 135–145)
Total Protein: 7.3 g/dL (ref 6.0–8.3)

## 2011-12-28 LAB — LIPID PANEL: HDL: 85.4 mg/dL (ref >39.00)

## 2011-12-28 NOTE — Progress Notes (Signed)
Quick Note:  Spoke with pt- informed of results ______ 

## 2011-12-30 ENCOUNTER — Ambulatory Visit (INDEPENDENT_AMBULATORY_CARE_PROVIDER_SITE_OTHER)
Admission: RE | Admit: 2011-12-30 | Discharge: 2011-12-30 | Disposition: A | Discharge status: Home / Self Care | Payer: Medicare Other | Source: Ambulatory Visit

## 2011-12-30 DIAGNOSIS — M899 Disorder of bone, unspecified: Secondary | ICD-10-CM | POA: Diagnosis not present

## 2011-12-30 DIAGNOSIS — C50919 Malignant neoplasm of unspecified site of unspecified female breast: Secondary | ICD-10-CM

## 2011-12-30 DIAGNOSIS — M8580 Other specified disorders of bone density and structure, unspecified site: Secondary | ICD-10-CM

## 2011-12-30 DIAGNOSIS — M949 Disorder of cartilage, unspecified: Secondary | ICD-10-CM | POA: Diagnosis not present

## 2011-12-30 DIAGNOSIS — C50911 Malignant neoplasm of unspecified site of right female breast: Secondary | ICD-10-CM

## 2011-12-31 DIAGNOSIS — H04129 Dry eye syndrome of unspecified lacrimal gland: Secondary | ICD-10-CM | POA: Diagnosis not present

## 2011-12-31 DIAGNOSIS — H5210 Myopia, unspecified eye: Secondary | ICD-10-CM | POA: Diagnosis not present

## 2011-12-31 DIAGNOSIS — H52229 Regular astigmatism, unspecified eye: Secondary | ICD-10-CM | POA: Diagnosis not present

## 2012-01-01 ENCOUNTER — Telehealth: Payer: Self-pay | Admitting: Internal Medicine

## 2012-01-01 NOTE — Telephone Encounter (Signed)
Caller: Lanette/Patient; Patient Name: Tiffany Reed; PCP: Eleonore Chiquito [facility]); Best Callback Phone Number: [phone]. Call regarding Synthroid and Hydrochlorothiazide refills.  Patient last seen on 9-6, lab work up-to-date.  Patient leaving country for 5 weeks on 9-23, only has 2 weeks worth of medications remaining.  Patient requesting 90 days script [facility] to [facility] [place], listed in Epic.

## 2012-01-04 NOTE — Telephone Encounter (Signed)
Spoke with pt- should have RF at [facility] that were sent in last week at appt - she has checked and yes they are there. KIK

## 2012-01-11 NOTE — Progress Notes (Signed)
Quick Note:  Attempt to call -VM - LMTCB if questions - gave dr. kwiatkowski's instructions ______ 

## 2012-02-18 ENCOUNTER — Ambulatory Visit: Payer: Medicare Other | Admitting: Internal Medicine

## 2012-02-19 ENCOUNTER — Ambulatory Visit (INDEPENDENT_AMBULATORY_CARE_PROVIDER_SITE_OTHER): Payer: Medicare Other | Admitting: Internal Medicine

## 2012-02-19 ENCOUNTER — Encounter: Payer: Self-pay | Admitting: Internal Medicine

## 2012-02-19 VITALS — BP 120/78 | Temp 97.9°F | Wt 154.0 lb

## 2012-02-19 DIAGNOSIS — I10 Essential (primary) hypertension: Secondary | ICD-10-CM

## 2012-02-19 DIAGNOSIS — S93409A Sprain of unspecified ligament of unspecified ankle, initial encounter: Secondary | ICD-10-CM | POA: Diagnosis not present

## 2012-02-19 NOTE — Progress Notes (Signed)
Subjective:    Patient ID: Tiffany Reed, female    DOB: 1940-05-30, 71 y.o.   MRN: [id number]  HPI  71 year old patient who is seen today following a 30 day [place] trip. During this period time she did considerable walking. She also strained her left foot and ankle on 2 occasions. She is seen today with persistent swelling of left foot as well as some residual discomfort.  Past Medical History  Diagnosis Date  . Breast cancer 2005    right, s/p xrt  . Hypertension   . Hypothyroidism   . Fluttering heart     and racing over the past several years with lightheadedness  . H/O urinary incontinence     History   Social History  . Marital Status: Married    Spouse Name: N/A    Number of Children: N/A  . Years of Education: N/A   Occupational History  . Not on file.   Social History Main Topics  . Smoking status: Never Smoker   . Smokeless tobacco: Never Used  . Alcohol Use: Yes     rare  . Drug Use: No  . Sexually Active: Not on file   Other Topics Concern  . Not on file   Social History Narrative   Pt lives in [place].   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    Past Surgical History  Procedure Date  . Breast lumpectomy 2005    right  . Ganglion cyst excision 2005    left thumb  . Hysteroscopy [id number]  . Thyroid lobectomy 2004  . Neuroplasty / transposition median nerve at carpal tunnel bilateral 2002  . Plantar fibroma     4th toe, right foot  . Excision morton's neuroma 2001    left foot  . Hemorroidectomy 1973  . Pilonidal cyst / sinus excision 1968  . Hammer toe surgery 2012    right 5th toe    Family History  Problem Relation Age of Onset  . Colon cancer Neg Hx   . Stomach cancer Neg Hx     Allergies  Allergen Reactions  . Sorbitol     Gas, diarrhea  . Penicillins Rash    Current Outpatient Prescriptions on File Prior to Visit  Medication Sig Dispense Refill  . hydrochlorothiazide (HYDRODIURIL) 12.5 MG tablet  Take 1 tablet (12.5 mg total) by mouth daily.  90 tablet  11  . naproxen sodium (ANAPROX) 550 MG tablet Take 550 mg by mouth 2 (two) times daily with a meal.       . SYNTHROID 75 MCG tablet Take 1 tablet (75 mcg total) by mouth daily.  90 tablet  6    BP 120/78  Temp 97.9 F (36.6 C) (Oral)  Wt 154 lb (69.854 kg)       Review of Systems  Constitutional: Negative.   HENT: Negative for hearing loss, congestion, sore throat, rhinorrhea, dental problem, sinus pressure and tinnitus.   Eyes: Negative for pain, discharge and visual disturbance.  Respiratory: Negative for cough and shortness of breath.   Cardiovascular: Negative for chest pain, palpitations and leg swelling.  Gastrointestinal: Negative for nausea, vomiting, abdominal pain, diarrhea, constipation, blood in stool and abdominal distention.  Genitourinary: Negative for dysuria, urgency, frequency, hematuria, flank pain, vaginal bleeding, vaginal discharge, difficulty urinating, vaginal pain and pelvic pain.  Musculoskeletal: Positive for joint swelling. Negative for arthralgias and gait problem.  Skin: Negative for rash.  Neurological: Negative for dizziness, syncope, speech  difficulty, weakness, numbness and headaches.  Hematological: Negative for adenopathy.  Psychiatric/Behavioral: Negative for behavioral problems, dysphoric mood and agitation. The patient is not nervous/anxious.        Objective:   Physical Exam  Constitutional: She appears well-developed and well-nourished. No distress.  Musculoskeletal:       Minimal left ankle swelling and some swelling involving the dorsal aspect of the left foot. No calf swelling or tenderness          Assessment & Plan:   Left ankle a strain Foot  Trauma  Will treat with naproxen rest elevation. If unimproved next week or she develops more significant edema we'll consider a lower extremity venous Doppler evaluation

## 2012-02-19 NOTE — Patient Instructions (Signed)
Limit your sodium (Salt) intake  You  may move around, but avoid painful motions and activities.  Apply ice to the sore area for 15 to 20 minutes 3 or 4 times daily for the next two to 3 days.  Naprelan  One daily  Call or return to clinic prn if these symptoms worsen or fail to improve as anticipated.

## 2012-05-06 DIAGNOSIS — M6740 Ganglion, unspecified site: Secondary | ICD-10-CM | POA: Diagnosis not present

## 2012-05-06 DIAGNOSIS — R229 Localized swelling, mass and lump, unspecified: Secondary | ICD-10-CM | POA: Diagnosis not present

## 2012-05-17 ENCOUNTER — Other Ambulatory Visit: Payer: Self-pay | Admitting: Orthopedic Surgery

## 2012-06-09 ENCOUNTER — Encounter (HOSPITAL_BASED_OUTPATIENT_CLINIC_OR_DEPARTMENT_OTHER): Payer: Self-pay | Admitting: *Deleted

## 2012-06-09 NOTE — Progress Notes (Signed)
To come in for bmet 

## 2012-06-14 ENCOUNTER — Encounter (HOSPITAL_BASED_OUTPATIENT_CLINIC_OR_DEPARTMENT_OTHER)
Admission: RE | Admit: 2012-06-14 | Discharge: 2012-06-14 | Disposition: A | Discharge status: Home / Self Care | Payer: Medicare Other | Source: Ambulatory Visit | Attending: Orthopedic Surgery | Admitting: Orthopedic Surgery

## 2012-06-14 DIAGNOSIS — M8569 Other cyst of bone, multiple sites: Secondary | ICD-10-CM | POA: Diagnosis not present

## 2012-06-14 DIAGNOSIS — M65839 Other synovitis and tenosynovitis, unspecified forearm: Secondary | ICD-10-CM | POA: Diagnosis not present

## 2012-06-14 DIAGNOSIS — Z01812 Encounter for preprocedural laboratory examination: Secondary | ICD-10-CM | POA: Diagnosis not present

## 2012-06-14 DIAGNOSIS — I10 Essential (primary) hypertension: Secondary | ICD-10-CM | POA: Diagnosis not present

## 2012-06-14 DIAGNOSIS — Z79899 Other long term (current) drug therapy: Secondary | ICD-10-CM | POA: Diagnosis not present

## 2012-06-14 DIAGNOSIS — E039 Hypothyroidism, unspecified: Secondary | ICD-10-CM | POA: Diagnosis not present

## 2012-06-14 LAB — BASIC METABOLIC PANEL
BUN: 18 mg/dL (ref 6–23)
CO2: 32 mEq/L (ref 19–32)
GFR calc non Af Amer: 88 mL/min — ABNORMAL LOW (ref >90)
Glucose, Bld: 90 mg/dL (ref 70–99)
Potassium: 3.7 mEq/L (ref 3.5–5.1)

## 2012-06-15 ENCOUNTER — Encounter (HOSPITAL_BASED_OUTPATIENT_CLINIC_OR_DEPARTMENT_OTHER): Payer: Self-pay | Admitting: Anesthesiology

## 2012-06-15 ENCOUNTER — Encounter (HOSPITAL_BASED_OUTPATIENT_CLINIC_OR_DEPARTMENT_OTHER)
Admission: RE | Disposition: A | Discharge status: Home / Self Care | Payer: Self-pay | Source: Ambulatory Visit | Attending: Orthopedic Surgery

## 2012-06-15 ENCOUNTER — Encounter (HOSPITAL_BASED_OUTPATIENT_CLINIC_OR_DEPARTMENT_OTHER): Payer: Self-pay | Admitting: *Deleted

## 2012-06-15 ENCOUNTER — Ambulatory Visit (HOSPITAL_BASED_OUTPATIENT_CLINIC_OR_DEPARTMENT_OTHER)
Admission: RE | Admit: 2012-06-15 | Discharge: 2012-06-15 | Disposition: A | Discharge status: Home / Self Care | Payer: Medicare Other | Source: Ambulatory Visit | Attending: Orthopedic Surgery | Admitting: Orthopedic Surgery

## 2012-06-15 ENCOUNTER — Ambulatory Visit (HOSPITAL_BASED_OUTPATIENT_CLINIC_OR_DEPARTMENT_OTHER): Payer: Medicare Other | Admitting: Anesthesiology

## 2012-06-15 DIAGNOSIS — E039 Hypothyroidism, unspecified: Secondary | ICD-10-CM | POA: Insufficient documentation

## 2012-06-15 DIAGNOSIS — Z01812 Encounter for preprocedural laboratory examination: Secondary | ICD-10-CM | POA: Insufficient documentation

## 2012-06-15 DIAGNOSIS — I10 Essential (primary) hypertension: Secondary | ICD-10-CM | POA: Insufficient documentation

## 2012-06-15 DIAGNOSIS — Z79899 Other long term (current) drug therapy: Secondary | ICD-10-CM | POA: Insufficient documentation

## 2012-06-15 DIAGNOSIS — M25539 Pain in unspecified wrist: Secondary | ICD-10-CM | POA: Diagnosis not present

## 2012-06-15 DIAGNOSIS — M65849 Other synovitis and tenosynovitis, unspecified hand: Secondary | ICD-10-CM | POA: Diagnosis not present

## 2012-06-15 DIAGNOSIS — M8569 Other cyst of bone, multiple sites: Secondary | ICD-10-CM | POA: Diagnosis not present

## 2012-06-15 DIAGNOSIS — G8918 Other acute postprocedural pain: Secondary | ICD-10-CM | POA: Diagnosis not present

## 2012-06-15 DIAGNOSIS — M65839 Other synovitis and tenosynovitis, unspecified forearm: Secondary | ICD-10-CM | POA: Diagnosis not present

## 2012-06-15 HISTORY — DX: Cardiac arrhythmia, unspecified: I49.9

## 2012-06-15 HISTORY — PX: EAR CYST EXCISION: SHX22

## 2012-06-15 SURGERY — CYST REMOVAL
Anesthesia: General | Site: Wrist | Laterality: Left | Wound class: Clean

## 2012-06-15 MED ORDER — CHLORHEXIDINE GLUCONATE 4 % EX LIQD: 60.0000 mL | Freq: Once | CUTANEOUS | Status: DC

## 2012-06-15 MED ORDER — DEXAMETHASONE SODIUM PHOSPHATE 10 MG/ML IJ SOLN
INTRAMUSCULAR | Status: DC | PRN
  Administered 2012-06-15: 8 mg

## 2012-06-15 MED ORDER — HYDROMORPHONE HCL PF 1 MG/ML IJ SOLN: 0.2500 mg | INTRAMUSCULAR | Status: DC | PRN

## 2012-06-15 MED ORDER — ONDANSETRON HCL 4 MG/2ML IJ SOLN: 4.0000 mg | Freq: Once | INTRAMUSCULAR | Status: DC | PRN

## 2012-06-15 MED ORDER — LACTATED RINGERS IV SOLN
INTRAVENOUS | Status: DC
  Administered 2012-06-15 (×2): via INTRAVENOUS

## 2012-06-15 MED ORDER — ONDANSETRON 8 MG PO TBDP
8.0000 mg | ORAL_TABLET | Freq: Once | ORAL | Status: AC | PRN
  Administered 2012-06-15: 8 mg via ORAL

## 2012-06-15 MED ORDER — DEXAMETHASONE SODIUM PHOSPHATE 4 MG/ML IJ SOLN
INTRAMUSCULAR | Status: DC | PRN
  Administered 2012-06-15: 10 mg via INTRAVENOUS

## 2012-06-15 MED ORDER — OXYCODONE-ACETAMINOPHEN 7.5-325 MG PO TABS: 1.0000 | ORAL_TABLET | ORAL | Status: DC | PRN

## 2012-06-15 MED ORDER — 0.9 % SODIUM CHLORIDE (POUR BTL) OPTIME
TOPICAL | Status: DC | PRN
  Administered 2012-06-15: 1000 mL

## 2012-06-15 MED ORDER — VANCOMYCIN HCL IN DEXTROSE 1-5 GM/200ML-% IV SOLN
1000.0000 mg | INTRAVENOUS | Status: AC
  Administered 2012-06-15: 1000 mg via INTRAVENOUS

## 2012-06-15 MED ORDER — FENTANYL CITRATE 0.05 MG/ML IJ SOLN
50.0000 ug | INTRAMUSCULAR | Status: DC | PRN
  Administered 2012-06-15: 100 ug via INTRAVENOUS

## 2012-06-15 MED ORDER — BUPIVACAINE-EPINEPHRINE PF 0.5-1:200000 % IJ SOLN
INTRAMUSCULAR | Status: DC | PRN
  Administered 2012-06-15: 25 mL

## 2012-06-15 MED ORDER — OXYCODONE HCL 5 MG PO TABS: 5.0000 mg | ORAL_TABLET | Freq: Once | ORAL | Status: DC | PRN

## 2012-06-15 MED ORDER — MIDAZOLAM HCL 2 MG/2ML IJ SOLN
1.0000 mg | INTRAMUSCULAR | Status: DC | PRN
  Administered 2012-06-15: 2 mg via INTRAVENOUS

## 2012-06-15 MED ORDER — LIDOCAINE HCL (CARDIAC) 20 MG/ML IV SOLN
INTRAVENOUS | Status: DC | PRN
  Administered 2012-06-15: 50 mg via INTRAVENOUS

## 2012-06-15 MED ORDER — PROPOFOL 10 MG/ML IV BOLUS
INTRAVENOUS | Status: DC | PRN
  Administered 2012-06-15: 200 mg via INTRAVENOUS

## 2012-06-15 MED ORDER — OXYCODONE HCL 5 MG/5ML PO SOLN: 5.0000 mg | Freq: Once | ORAL | Status: DC | PRN

## 2012-06-15 SURGICAL SUPPLY — 52 items
BANDAGE COBAN STERILE 2 (GAUZE/BANDAGES/DRESSINGS) IMPLANT
BANDAGE GAUZE ELAST BULKY 4 IN (GAUZE/BANDAGES/DRESSINGS) ×2 IMPLANT
BLADE MINI RND TIP GREEN BEAV (BLADE) ×2 IMPLANT
BLADE SURG 15 STRL LF DISP TIS (BLADE) ×1 IMPLANT
BLADE SURG 15 STRL SS (BLADE) ×1
BNDG COHESIVE 1X5 TAN STRL LF (GAUZE/BANDAGES/DRESSINGS) IMPLANT
BNDG COHESIVE 3X5 TAN STRL LF (GAUZE/BANDAGES/DRESSINGS) ×2 IMPLANT
BNDG ESMARK 4X9 LF (GAUZE/BANDAGES/DRESSINGS) ×2 IMPLANT
CHLORAPREP W/TINT 26ML (MISCELLANEOUS) ×2 IMPLANT
CLOTH BEACON ORANGE TIMEOUT ST (SAFETY) ×2 IMPLANT
CORDS BIPOLAR (ELECTRODE) ×2 IMPLANT
COVER MAYO STAND STRL (DRAPES) ×2 IMPLANT
COVER TABLE BACK 60X90 (DRAPES) ×2 IMPLANT
CUFF TOURNIQUET SINGLE 18IN (TOURNIQUET CUFF) ×2 IMPLANT
DECANTER SPIKE VIAL GLASS SM (MISCELLANEOUS) IMPLANT
DRAIN PENROSE 1/2X12 LTX STRL (WOUND CARE) IMPLANT
DRAPE EXTREMITY T 121X128X90 (DRAPE) ×2 IMPLANT
DRAPE OEC MINIVIEW 54X84 (DRAPES) ×2 IMPLANT
DRAPE SURG 17X23 STRL (DRAPES) ×2 IMPLANT
GAUZE XEROFORM 1X8 LF (GAUZE/BANDAGES/DRESSINGS) ×2 IMPLANT
GLOVE BIO SURGEON STRL SZ 6.5 (GLOVE) ×2 IMPLANT
GLOVE BIOGEL PI IND STRL 7.0 (GLOVE) ×1 IMPLANT
GLOVE BIOGEL PI IND STRL 8.5 (GLOVE) ×1 IMPLANT
GLOVE BIOGEL PI INDICATOR 7.0 (GLOVE) ×1
GLOVE BIOGEL PI INDICATOR 8.5 (GLOVE) ×1
GLOVE EXAM NITRILE EXT CUFF MD (GLOVE) ×2 IMPLANT
GLOVE SURG ORTHO 8.0 STRL STRW (GLOVE) ×2 IMPLANT
GOWN BRE IMP PREV XXLGXLNG (GOWN DISPOSABLE) ×2 IMPLANT
GOWN PREVENTION PLUS XLARGE (GOWN DISPOSABLE) ×2 IMPLANT
LOOP VESSEL MAXI BLUE (MISCELLANEOUS) ×2 IMPLANT
NEEDLE 27GAX1X1/2 (NEEDLE) IMPLANT
NS IRRIG 1000ML POUR BTL (IV SOLUTION) ×2 IMPLANT
PACK BASIN DAY SURGERY FS (CUSTOM PROCEDURE TRAY) ×2 IMPLANT
PAD CAST 3X4 CTTN HI CHSV (CAST SUPPLIES) ×1 IMPLANT
PADDING CAST ABS 3INX4YD NS (CAST SUPPLIES)
PADDING CAST ABS 4INX4YD NS (CAST SUPPLIES) ×1
PADDING CAST ABS COTTON 3X4 (CAST SUPPLIES) IMPLANT
PADDING CAST ABS COTTON 4X4 ST (CAST SUPPLIES) ×1 IMPLANT
PADDING CAST COTTON 3X4 STRL (CAST SUPPLIES) ×1
SLING ARM FOAM STRAP LRG (SOFTGOODS) ×2 IMPLANT
SPLINT PLASTER CAST XFAST 3X15 (CAST SUPPLIES) ×15 IMPLANT
SPLINT PLASTER XTRA FASTSET 3X (CAST SUPPLIES) ×15
SPONGE GAUZE 4X4 12PLY (GAUZE/BANDAGES/DRESSINGS) ×2 IMPLANT
STOCKINETTE 4X48 STRL (DRAPES) ×2 IMPLANT
SUT VIC AB 4-0 P2 18 (SUTURE) IMPLANT
SUT VICRYL RAPID 5 0 P 3 (SUTURE) IMPLANT
SUT VICRYL RAPIDE 4/0 PS 2 (SUTURE) ×2 IMPLANT
SYR BULB 3OZ (MISCELLANEOUS) ×2 IMPLANT
SYR CONTROL 10ML LL (SYRINGE) IMPLANT
TOWEL OR 17X24 6PK STRL BLUE (TOWEL DISPOSABLE) ×2 IMPLANT
UNDERPAD 30X30 INCONTINENT (UNDERPADS AND DIAPERS) ×2 IMPLANT
WATER STERILE IRR 1000ML POUR (IV SOLUTION) IMPLANT

## 2012-06-15 NOTE — Anesthesia Preprocedure Evaluation (Signed)

## 2012-06-15 NOTE — Transfer of Care (Signed)
Immediate Anesthesia Transfer of Care Note  Patient: Tiffany Reed  Procedure(s) Performed: Procedure(s) with comments: Left scaphoid cyst excision with distal radius graft (Left) - EXCISION CYST LEFT SCAPHOID DISTAL RADIUS GRAFT  Patient Location: PACU  Anesthesia Type:GA combined with regional for post-op pain  Level of Consciousness: sedated and patient cooperative  Airway & Oxygen Therapy: Patient Spontanous Breathing and Patient connected to face mask oxygen  Post-op Assessment: Report given to PACU RN and Post -op Vital signs reviewed and stable  Post vital signs: Reviewed and stable  Complications: No apparent anesthesia complications

## 2012-06-15 NOTE — Brief Op Note (Signed)
06/15/2012  11:25 AM  PATIENT:  Hortense D Galbraith  72 y.o. female  PRE-OPERATIVE DIAGNOSIS:  CYST LEFT SCAPHOID  POST-OPERATIVE DIAGNOSIS:  CYST LEFT SCAPHOID  PROCEDURE:  Procedure(s) with comments: Left scaphoid cyst excision with distal radius graft (Left) - EXCISION CYST LEFT SCAPHOID DISTAL RADIUS GRAFT  SURGEON:  Surgeon(s) and Role:    * Nicki Reaper, MD - Primary  PHYSICIAN ASSISTANT:   ASSISTANTS: none   ANESTHESIA:   regional and general  EBL:  Total I/O In: 1150 [I.V.:1150] Out: -   BLOOD ADMINISTERED:none  DRAINS: none   LOCAL MEDICATIONS USED:  NONE  SPECIMEN:  No Specimen  DISPOSITION OF SPECIMEN:  N/A  COUNTS:  YES  TOURNIQUET:   Total Tourniquet Time Documented: Upper Arm (Left) - 84 minutes Total: Upper Arm (Left) - 84 minutes   DICTATION: .Other Dictation: Dictation Number [id number]  PLAN OF CARE: Discharge to home after PACU  PATIENT DISPOSITION:  PACU - hemodynamically stable.

## 2012-06-15 NOTE — Op Note (Signed)
Dictation Number [id number]

## 2012-06-15 NOTE — Progress Notes (Signed)
  Assisted Dr. Crews with left, ultrasound guided, supraclavicular block. Side rails up, monitors on throughout procedure. See vital signs in flow sheet. Tolerated Procedure well. 

## 2012-06-15 NOTE — Anesthesia Procedure Notes (Addendum)
Anesthesia Regional Block:  Supraclavicular block  Pre-Anesthetic Checklist: ,, timeout performed, Correct Patient, Correct Site, Correct Laterality, Correct Procedure, Correct Position, site marked, Risks and benefits discussed,  Surgical consent,  Pre-op evaluation,  At surgeon's request and post-op pain management  Laterality: Left and Upper  Prep: chloraprep       Needles:  Injection technique: Single-shot  Needle Type: Echogenic Needle     Needle Length: 5cm 5 cm Needle Gauge: 21    Additional Needles:  Procedures: ultrasound guided (picture in chart) Supraclavicular block Narrative:  Start time: 06/15/2012 9:01 AM End time: 06/15/2012 9:10 AM Injection made incrementally with aspirations every 5 mL.  Performed by: Personally  Anesthesiologist: Sheldon Silvan  Supraclavicular block Procedure Name: LMA Insertion Date/Time: 06/15/2012 9:36 AM Performed by: Gar Gibbon Pre-anesthesia Checklist: Patient identified, Emergency Drugs available, Suction available and Patient being monitored Patient Re-evaluated:Patient Re-evaluated prior to inductionOxygen Delivery Method: Circle System Utilized Preoxygenation: Pre-oxygenation with 100% oxygen Intubation Type: IV induction Ventilation: Mask ventilation without difficulty LMA: LMA inserted LMA Size: 4.0 Number of attempts: 1 Airway Equipment and Method: bite block Placement Confirmation: positive ETCO2 Tube secured with: Tape Dental Injury: Teeth and Oropharynx as per pre-operative assessment

## 2012-06-15 NOTE — H&P (Signed)
Tiffany Reed is a 72 year-old right-hand dominant female who has not been seen in a number of years following carpal tunnel releases. She comes in complaining of multiple masses, one on the dorsal aspect IP joint of her right thumb on the volar aspect of her left wrist on the metacarpophalangeal joint volar aspect of her left little finger. She recalls no specific history of injury.  She is complaining primarily of the one on the volar radial aspect of her wrist with a mild dull discomfort.  She has not taken anything for this.  She recalls no history of injury. She has no history of diabetes or thyroid problems. She does have history of arthritis, no history of gout.She has had the MRI done of her left wrist and this reveals that she has a large cyst in the distal pole of her scaphoid volarly located confluent with the tenosynovitis of her flexor carpi radialis tendon  ALLERGIES:   Ampicillin.  MEDICATIONS:    Synthroid, HCTZ.  SURGICAL HISTORY:    Right breast lumpectomy (w/radiation), ganglion cyst left thumb, hysteroscopy D&C (x2) and excision of vaginal lesion, plantar fibroma and hammer toe right foot, left thyroid lobectomy, bilateral carpal tunnel, Morton's neuroma, hemorrhoid surgery and pilonidal cyst excision.  FAMILY MEDICAL HISTORY:    Positive for diabetes, arthritis.  SOCIAL HISTORY:     She does not smoke or drink.  She is married and retired.  REVIEW OF SYSTEMS:    Positive for breast cancer, glasses, contacts, easy bruising, otherwise negative 14 points.  Tiffany Reed is an 72 y.o. female.   Chief Complaint: cyst  LT scaphoid HPI: see above  Past Medical History  Diagnosis Date  . Breast cancer 2005    right, s/p xrt  . Hypertension   . Hypothyroidism   . Fluttering heart     and racing over the past several years with lightheadedness  . H/O urinary incontinence   . Dysrhythmia     had halter monitor 4/13-all ok for palpatations    Past Surgical History  Procedure  Laterality Date  . Breast lumpectomy  2005    right  . Ganglion cyst excision  2005    left thumb  . Hysteroscopy  [id number]  . Thyroid lobectomy  2004  . Neuroplasty / transposition median nerve at carpal tunnel bilateral  2002  . Plantar fibroma      4th toe, right foot  . Excision morton's neuroma  2001    left foot  . Hemorroidectomy  1973  . Pilonidal cyst / sinus excision  1968  . Hammer toe surgery  2012    right 5th toe    Family History  Problem Relation Age of Onset  . Colon cancer Neg Hx   . Stomach cancer Neg Hx    Social History:  reports that she has never smoked. She has never used smokeless tobacco. She reports that  drinks alcohol. She reports that she does not use illicit drugs.  Allergies:  Allergies  Allergen Reactions  . Sorbitol     Gas, diarrhea  . Penicillins Rash    Medications Prior to Admission  Medication Sig Dispense Refill  . hydrochlorothiazide (HYDRODIURIL) 12.5 MG tablet Take 1 tablet (12.5 mg total) by mouth daily.  90 tablet  11  . SYNTHROID 75 MCG tablet Take 1 tablet (75 mcg total) by mouth daily.  90 tablet  6    Results for orders placed during the hospital encounter of 06/15/12 (from the  past 48 hour(s))  BASIC METABOLIC PANEL     Status: Abnormal   Collection Time    06/14/12  3:00 PM      Result Value Range   Sodium 139  135 - 145 mEq/L   Potassium 3.7  3.5 - 5.1 mEq/L   Chloride 99  96 - 112 mEq/L   CO2 32  19 - 32 mEq/L   Glucose, Bld 90  70 - 99 mg/dL   BUN 18  6 - 23 mg/dL   Creatinine, Ser 4.09  0.50 - 1.10 mg/dL   Calcium 9.7  8.4 - 81.1 mg/dL   GFR calc non Af Amer 88 (*) >90 mL/min   GFR calc Af Amer >90  >90 mL/min   Comment:            The eGFR has been calculated     using the CKD EPI equation.     This calculation has not been     validated in all clinical     situations.     eGFR's persistently     <90 mL/min signify     possible Chronic Kidney Disease.    No results found.   Pertinent items are  noted in HPI.  Blood pressure 132/73, pulse 59, temperature 97.5 F (36.4 C), temperature source Oral, resp. rate 16, height 5\' 3"  (1.6 m), weight 67.042 kg (147 lb 12.8 oz), SpO2 100.00%.  General appearance: alert, cooperative and appears stated age Head: Normocephalic, without obvious abnormality Neck: no JVD Resp: clear to auscultation bilaterally Cardio: regular rate and rhythm, S1, S2 normal, no murmur, click, rub or gallop GI: soft, non-tender; bowel sounds normal; no masses,  no organomegaly Extremities: extremities normal, atraumatic, no cyanosis or edema Pulses: 2+ and symmetric Skin: Skin color, texture, turgor normal. No rashes or lesions Neurologic: Grossly normal Incision/Wound: na  Assessment/Plan DX: Cyst LT Scaphoid She is desirous of having this treated with excision of the cyst, distal radius bone graft.   The pre, peri and postoperative course were discussed along with the risks and complications.  The patient is aware there is no guarantee with the surgery, possibility of infection, recurrence, injury to arteries, nerves, tendons, the length of time for healing, the use of splints.  Treyveon Mochizuki R 06/15/2012, 9:11 AM

## 2012-06-15 NOTE — Anesthesia Postprocedure Evaluation (Signed)
  Anesthesia Post-op Note  Patient: Tiffany Reed  Procedure(s) Performed: Procedure(s) with comments: Left scaphoid cyst excision with distal radius graft (Left) - EXCISION CYST LEFT SCAPHOID DISTAL RADIUS GRAFT  Patient Location: PACU  Anesthesia Type:GA combined with regional for post-op pain  Level of Consciousness: awake, alert  and oriented  Airway and Oxygen Therapy: Patient Spontanous Breathing  Post-op Pain: none  Post-op Assessment: Post-op Vital signs reviewed  Post-op Vital Signs: Reviewed  Complications: No apparent anesthesia complications

## 2012-06-16 ENCOUNTER — Encounter (HOSPITAL_BASED_OUTPATIENT_CLINIC_OR_DEPARTMENT_OTHER): Payer: Self-pay | Admitting: Orthopedic Surgery

## 2012-06-16 NOTE — Op Note (Signed)
NAMELUCAS, EXLINE.:  [id number]  MEDICAL RECORD NO.:  [id number]  LOCATION:                                 FACILITY:  PHYSICIAN:  Cindee Salt, M.D.            DATE OF BIRTH:  DATE OF PROCEDURE:  06/15/2012 DATE OF DISCHARGE:                              OPERATIVE REPORT   PREOPERATIVE DIAGNOSIS:  Cyst, distal pole, left scaphoid.  POSTOPERATIVE DIAGNOSIS:  Cyst, distal pole, left scaphoid.  OPERATION:  Excision cyst with bone graft from distal radius, left scaphoid.  SURGEON:  Cindee Salt, MD  ANESTHESIA:  Supraclavicular block general.  ANESTHESIOLOGIST:  Sheldon Silvan, M.D.  HISTORY:  The patient is a 72 year old female with a history of pain in her left wrist.  She has a large cyst present in the distal pole of her scaphoid.  This is quite evident on MRI.  Plan is for excision bone graft.  Pre, peri, and postoperative course have been discussed along with risks and complications.  She is aware, there is no guarantee with surgery; possibility of infection; recurrence of injury to arteries, nerves, tendons; incomplete relief of symptoms, dystrophy.  In the preoperative area, the patient was seen, the extremity marked by both patient and surgeon.  Antibiotic given.  PROCEDURE IN DETAIL:  The patient was brought to the operating room where a supraclavicular block general anesthetic was carried out without difficulty.  She was prepped using ChloraPrep, supine position, left arm free.  A 3-minute dry time was allowed.  Time-out taken, confirming patient and procedure.  A longitudinal incision was made on the radial aspect of her left wrist, carried down through subcutaneous tissue. Bleeders were electrocauterized with bipolar.  The palmar branch of the radial artery was identified and protected.  A vessel loop was placed. The dissection carried down flexor carpi radialis tendon sheath.  Distal pole of scaphoid was identified.  The radioscaphocapitate  ligament was protected under image intensification.  A drill holes were placed in the distal portion tubercle of the scaphoid.  This allowed the opening of the cyst, this was curetted with a House curette and angled House curette.  This was confirmed with image intensification by placing part of a Ray-Tec sponge, radiolucent portion into the cyst.  X-rays confirmed that this showed complete filling of the cyst distally.  The incision was extended proximally across the distal forearm.  The dissection was carried down to the radial aspect of the flexor carpi radialis tendon protecting the radial artery.  The pronator quadratus was incised.  The periosteum elevated.  Drill holes were placed using a 3.5 K-wire for elevation of a window in the volar cortex of the distal radius and with angled curette, a bone graft was removed.  This was then packed into the scaphoid, after irrigation with a syringe and needle. This allowed the bone graft to be fully packed.  X-rays confirmed complete filling of the distal pole of the scaphoid with the graft.  The wound was irrigated proximally.  The pronator quadratus repaired with figure-of-eight 3-0 Vicryl sutures.  The subcutaneous tissue closed. After closure of the periosteum  over the distal pole of the scaphoid with 3-0 Vicryl.  The skin was then closed with a subcuticular 4-0 Vicryl Rapide suture. A sterile compressive dressing, dorsal palmar thumb spica splint applied.  On deflation of the tourniquet, all fingers immediately pinked.  She was taken to the recovery room for observation in satisfactory condition.  She will be discharged home to return to the [facility] in 1 week on Percocet.          ______________________________ Cindee Salt, M.D.     GK/MEDQ  D:  06/15/2012  T:  06/16/2012  Job:  [id number]

## 2012-07-06 ENCOUNTER — Other Ambulatory Visit: Payer: Self-pay

## 2012-07-06 DIAGNOSIS — Z853 Personal history of malignant neoplasm of breast: Secondary | ICD-10-CM

## 2012-07-06 DIAGNOSIS — Z1231 Encounter for screening mammogram for malignant neoplasm of breast: Secondary | ICD-10-CM

## 2012-07-27 DIAGNOSIS — M8569 Other cyst of bone, multiple sites: Secondary | ICD-10-CM | POA: Diagnosis not present

## 2012-07-27 DIAGNOSIS — M19049 Primary osteoarthritis, unspecified hand: Secondary | ICD-10-CM | POA: Diagnosis not present

## 2012-08-01 ENCOUNTER — Ambulatory Visit
Admission: RE | Admit: 2012-08-01 | Discharge: 2012-08-01 | Disposition: A | Discharge status: Home / Self Care | Payer: Medicare Other | Source: Ambulatory Visit

## 2012-08-01 DIAGNOSIS — Z1231 Encounter for screening mammogram for malignant neoplasm of breast: Secondary | ICD-10-CM

## 2012-08-01 DIAGNOSIS — Z853 Personal history of malignant neoplasm of breast: Secondary | ICD-10-CM

## 2012-09-29 DIAGNOSIS — H52229 Regular astigmatism, unspecified eye: Secondary | ICD-10-CM | POA: Diagnosis not present

## 2012-09-29 DIAGNOSIS — H43399 Other vitreous opacities, unspecified eye: Secondary | ICD-10-CM | POA: Diagnosis not present

## 2012-09-29 DIAGNOSIS — H5200 Hypermetropia, unspecified eye: Secondary | ICD-10-CM | POA: Diagnosis not present

## 2012-09-29 DIAGNOSIS — H35039 Hypertensive retinopathy, unspecified eye: Secondary | ICD-10-CM | POA: Diagnosis not present

## 2012-12-07 DIAGNOSIS — Z01419 Encounter for gynecological examination (general) (routine) without abnormal findings: Secondary | ICD-10-CM | POA: Diagnosis not present

## 2012-12-15 DIAGNOSIS — M6740 Ganglion, unspecified site: Secondary | ICD-10-CM | POA: Diagnosis not present

## 2013-01-09 ENCOUNTER — Other Ambulatory Visit: Payer: Self-pay | Admitting: Internal Medicine

## 2013-01-12 NOTE — Telephone Encounter (Signed)
Pt needs hydrochlorothiazide (HYDRODIURIL) 12.5 MG tablet ( 1 X day) (90 day )before her appt on 09/30. Can you refill[facility]

## 2013-01-17 ENCOUNTER — Ambulatory Visit (INDEPENDENT_AMBULATORY_CARE_PROVIDER_SITE_OTHER): Payer: Medicare Other | Admitting: Internal Medicine

## 2013-01-17 ENCOUNTER — Encounter: Payer: Self-pay | Admitting: Internal Medicine

## 2013-01-17 VITALS — BP 130/80 | HR 56 | Temp 98.3°F | Resp 18 | Ht 63.5 in | Wt 137.0 lb

## 2013-01-17 DIAGNOSIS — C50919 Malignant neoplasm of unspecified site of unspecified female breast: Secondary | ICD-10-CM | POA: Diagnosis not present

## 2013-01-17 DIAGNOSIS — E039 Hypothyroidism, unspecified: Secondary | ICD-10-CM

## 2013-01-17 DIAGNOSIS — I10 Essential (primary) hypertension: Secondary | ICD-10-CM

## 2013-01-17 DIAGNOSIS — M899 Disorder of bone, unspecified: Secondary | ICD-10-CM | POA: Diagnosis not present

## 2013-01-17 DIAGNOSIS — Z23 Encounter for immunization: Secondary | ICD-10-CM | POA: Diagnosis not present

## 2013-01-17 DIAGNOSIS — Z0000 Encounter for general adult medical examination without abnormal findings: Secondary | ICD-10-CM

## 2013-01-17 DIAGNOSIS — R002 Palpitations: Secondary | ICD-10-CM | POA: Diagnosis not present

## 2013-01-17 DIAGNOSIS — M549 Dorsalgia, unspecified: Secondary | ICD-10-CM | POA: Diagnosis not present

## 2013-01-17 DIAGNOSIS — C50911 Malignant neoplasm of unspecified site of right female breast: Secondary | ICD-10-CM

## 2013-01-17 DIAGNOSIS — E785 Hyperlipidemia, unspecified: Secondary | ICD-10-CM | POA: Diagnosis not present

## 2013-01-17 DIAGNOSIS — M8580 Other specified disorders of bone density and structure, unspecified site: Secondary | ICD-10-CM

## 2013-01-17 LAB — CBC WITH DIFFERENTIAL/PLATELET
Basophils Relative: 0.4 % (ref 0.0–3.0)
Eosinophils Relative: 0.7 % (ref 0.0–5.0)
Hemoglobin: 12.9 g/dL (ref 12.0–15.0)
Lymphocytes Relative: 16.2 % (ref 12.0–46.0)
Monocytes Relative: 4.7 % (ref 3.0–12.0)
Neutro Abs: 4.7 10*3/uL (ref 1.4–7.7)
RBC: 4.31 Mil/uL (ref 3.87–5.11)

## 2013-01-17 LAB — COMPREHENSIVE METABOLIC PANEL
AST: 18 U/L (ref 0–37)
BUN: 12 mg/dL (ref 6–23)
CO2: 30 mEq/L (ref 19–32)
Calcium: 9.3 mg/dL (ref 8.4–10.5)
Chloride: 102 mEq/L (ref 96–112)
Creatinine, Ser: 0.6 mg/dL (ref 0.4–1.2)
GFR: 97.54 mL/min (ref >60.00)

## 2013-01-17 LAB — TSH: TSH: 1.18 u[IU]/mL (ref 0.35–5.50)

## 2013-01-17 LAB — LIPID PANEL
Cholesterol: 203 mg/dL — ABNORMAL HIGH (ref 0–200)
VLDL: 14.4 mg/dL (ref 0.0–40.0)

## 2013-01-17 MED ORDER — SYNTHROID 75 MCG PO TABS: 75.0000 ug | ORAL_TABLET | Freq: Every day | ORAL | Status: DC

## 2013-01-17 NOTE — Progress Notes (Signed)
Subjective:    Patient ID: Tiffany Reed, female    DOB: May 15, 1940, 72 y.o.   MRN: [id number]  HPI 3 -year-old patient who is seen today for a wellness exam. She has remote history of breast cancer status post resection in 2005. She has hypertension hypothyroidism and a history of osteopenia. She has seen cardiology recently due  to palpitations. Evaluation included a 2-D echocardiogram.   she also has a history of osteopenia       Past Medical History  Diagnosis Date  . Breast cancer 2005    right, s/p xrt  . Hypertension   . Hypothyroidism   . Fluttering heart     and racing over the past several years with lightheadedness  . H/O urinary incontinence   . Dysrhythmia     had halter monitor 4/13-all ok for palpatations    History   Social History  . Marital Status: Married    Spouse Name: N/A    Number of Children: N/A  . Years of Education: N/A   Occupational History  . Not on file.   Social History Main Topics  . Smoking status: Never Smoker   . Smokeless tobacco: Never Used  . Alcohol Use: Yes     Comment: rare  . Drug Use: No  . Sexual Activity: Not on file   Other Topics Concern  . Not on file   Social History Narrative   Pt lives in [place].   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          Past Surgical History  Procedure Laterality Date  . Breast lumpectomy  2005    right  . Ganglion cyst excision  2005    left thumb  . Hysteroscopy  [id number]  . Thyroid lobectomy  2004  . Neuroplasty / transposition median nerve at carpal tunnel bilateral  2002  . Plantar fibroma      4th toe, right foot  . Excision morton's neuroma  2001    left foot  . Hemorroidectomy  1973  . Pilonidal cyst / sinus excision  1968  . Hammer toe surgery  2012    right 5th toe  . Ear cyst excision Left 06/15/2012    Procedure: Left scaphoid cyst excision with distal radius graft;  Surgeon: Nicki Reaper, MD;  Location: [facility];   Service: Orthopedics;  Laterality: Left;  EXCISION CYST LEFT SCAPHOID DISTAL RADIUS GRAFT    Family History  Problem Relation Age of Onset  . Colon cancer Neg Hx   . Stomach cancer Neg Hx     Allergies  Allergen Reactions  . Sorbitol     Gas, diarrhea  . Penicillins Rash    Current Outpatient Prescriptions on File Prior to Visit  Medication Sig Dispense Refill  . hydrochlorothiazide (MICROZIDE) 12.5 MG capsule TAKE 1 TABLET (12.5 MG TOTAL) BY MOUTH DAILY.  90 capsule  3   No current facility-administered medications on file prior to visit.    BP 130/80  Pulse 56  Temp(Src) 98.3 F (36.8 C) (Oral)  Resp 18  Ht 5' 3.5" (1.613 m)  Wt 137 lb (62.143 kg)  BMI 23.88 kg/m2  SpO2 98%   1. Risk factors, based on past  M,S,F history-  CV risk  Factors include HTN  2.  Physical activities: no restrictions  3.  Depression/mood: neg  4.  Hearing: neg  5.  ADL's: independent  6.  Fall risk: low  7.  Home safety: no problems  8.  Height weight, and visual acuity; h/o decr ht  9.  Counseling: exercise , vit D  10. Lab orders based on risk factors:  today  11. Referral : DEXA  12. Care plan: exercise; consider osteporosis rx  13. Cognitive assessment:  Alert; appropriate       Review of Systems  Constitutional: Negative for fever, appetite change, fatigue and unexpected weight change.  HENT: Negative for hearing loss, ear pain, nosebleeds, congestion, sore throat, mouth sores, trouble swallowing, neck stiffness, dental problem, voice change, sinus pressure and tinnitus.   Eyes: Negative for photophobia, pain, redness and visual disturbance.  Respiratory: Negative for cough, chest tightness and shortness of breath.   Cardiovascular: Negative for chest pain, palpitations and leg swelling.  Gastrointestinal: Negative for nausea, vomiting, abdominal pain, diarrhea, constipation, blood in stool, abdominal distention and rectal pain.  Genitourinary: Negative for  dysuria, urgency, frequency, hematuria, flank pain, vaginal bleeding, vaginal discharge, difficulty urinating, genital sores, vaginal pain, menstrual problem and pelvic pain.  Musculoskeletal: Negative for back pain and arthralgias.  Skin: Negative for rash.  Neurological: Negative for dizziness, syncope, speech difficulty, weakness, light-headedness, numbness and headaches.  Hematological: Negative for adenopathy. Does not bruise/bleed easily.  Psychiatric/Behavioral: Negative for suicidal ideas, behavioral problems, self-injury, dysphoric mood and agitation. The patient is not nervous/anxious.        Objective:   Physical Exam  Constitutional: She is oriented to person, place, and time. She appears well-developed and well-nourished.  HENT:  Head: Normocephalic and atraumatic.  Right Ear: External ear normal.  Left Ear: External ear normal.  Mouth/Throat: Oropharynx is clear and moist.  Eyes: Conjunctivae and EOM are normal.  Neck: Normal range of motion. Neck supple. No JVD present. No thyromegaly present.  Cardiovascular: Normal rate, regular rhythm, normal heart sounds and intact distal pulses.   No murmur heard. And pulses faint   Pulmonary/Chest: Effort normal and breath sounds normal. She has no wheezes. She has no rales.  Right partial mastectomy  Abdominal: Soft. Bowel sounds are normal. She exhibits no distension and no mass. There is no tenderness. There is no rebound and no guarding.  Genitourinary: Vagina normal.  Musculoskeletal: Normal range of motion. She exhibits no edema and no tenderness.  Neurological: She is alert and oriented to person, place, and time. She has normal reflexes. No cranial nerve deficit. She exhibits normal muscle tone. Coordination normal.  Skin: Skin is warm and dry. No rash noted.  Psychiatric: She has a normal mood and affect. Her behavior is normal.          Assessment & Plan:   Preventive health examination  Hypertension stable   Hypothyroidism    followup DEXA scan   one year  Laboratory studies including lipid profile will be reviewed   Please check your blood pressure on a regular basis.  If it is consistently greater than 150/90, please make an office appointment.

## 2013-01-17 NOTE — Patient Instructions (Signed)
It is important that you exercise regularly, at least 20 minutes 3 to 4 times per week.  If you develop chest pain or shortness of breath seek  medical attention.  You need to lose weight.  Consider a lower calorie diet and regular exercise.  Return in one year for follow-up  Take a calcium supplement, plus [phone] units of vitamin D

## 2013-01-18 DIAGNOSIS — M999 Biomechanical lesion, unspecified: Secondary | ICD-10-CM | POA: Diagnosis not present

## 2013-01-18 DIAGNOSIS — M545 Low back pain: Secondary | ICD-10-CM | POA: Diagnosis not present

## 2013-01-18 DIAGNOSIS — M542 Cervicalgia: Secondary | ICD-10-CM | POA: Diagnosis not present

## 2013-01-18 DIAGNOSIS — M9981 Other biomechanical lesions of cervical region: Secondary | ICD-10-CM | POA: Diagnosis not present

## 2013-01-18 DIAGNOSIS — M5137 Other intervertebral disc degeneration, lumbosacral region: Secondary | ICD-10-CM | POA: Diagnosis not present

## 2013-01-18 DIAGNOSIS — M5030 Other cervical disc degeneration, unspecified cervical region: Secondary | ICD-10-CM | POA: Diagnosis not present

## 2013-01-19 DIAGNOSIS — M5030 Other cervical disc degeneration, unspecified cervical region: Secondary | ICD-10-CM | POA: Diagnosis not present

## 2013-01-19 DIAGNOSIS — M9981 Other biomechanical lesions of cervical region: Secondary | ICD-10-CM | POA: Diagnosis not present

## 2013-01-19 DIAGNOSIS — M5137 Other intervertebral disc degeneration, lumbosacral region: Secondary | ICD-10-CM | POA: Diagnosis not present

## 2013-01-19 DIAGNOSIS — M542 Cervicalgia: Secondary | ICD-10-CM | POA: Diagnosis not present

## 2013-01-19 DIAGNOSIS — M545 Low back pain: Secondary | ICD-10-CM | POA: Diagnosis not present

## 2013-01-19 DIAGNOSIS — M999 Biomechanical lesion, unspecified: Secondary | ICD-10-CM | POA: Diagnosis not present

## 2013-01-23 DIAGNOSIS — M5030 Other cervical disc degeneration, unspecified cervical region: Secondary | ICD-10-CM | POA: Diagnosis not present

## 2013-01-23 DIAGNOSIS — M542 Cervicalgia: Secondary | ICD-10-CM | POA: Diagnosis not present

## 2013-01-23 DIAGNOSIS — M9981 Other biomechanical lesions of cervical region: Secondary | ICD-10-CM | POA: Diagnosis not present

## 2013-01-23 DIAGNOSIS — M999 Biomechanical lesion, unspecified: Secondary | ICD-10-CM | POA: Diagnosis not present

## 2013-01-23 DIAGNOSIS — M545 Low back pain: Secondary | ICD-10-CM | POA: Diagnosis not present

## 2013-01-23 DIAGNOSIS — M5137 Other intervertebral disc degeneration, lumbosacral region: Secondary | ICD-10-CM | POA: Diagnosis not present

## 2013-01-26 DIAGNOSIS — M999 Biomechanical lesion, unspecified: Secondary | ICD-10-CM | POA: Diagnosis not present

## 2013-01-26 DIAGNOSIS — M5030 Other cervical disc degeneration, unspecified cervical region: Secondary | ICD-10-CM | POA: Diagnosis not present

## 2013-01-26 DIAGNOSIS — M9981 Other biomechanical lesions of cervical region: Secondary | ICD-10-CM | POA: Diagnosis not present

## 2013-01-26 DIAGNOSIS — M542 Cervicalgia: Secondary | ICD-10-CM | POA: Diagnosis not present

## 2013-01-26 DIAGNOSIS — M5137 Other intervertebral disc degeneration, lumbosacral region: Secondary | ICD-10-CM | POA: Diagnosis not present

## 2013-01-26 DIAGNOSIS — M545 Low back pain: Secondary | ICD-10-CM | POA: Diagnosis not present

## 2013-02-06 DIAGNOSIS — M9981 Other biomechanical lesions of cervical region: Secondary | ICD-10-CM | POA: Diagnosis not present

## 2013-02-06 DIAGNOSIS — M542 Cervicalgia: Secondary | ICD-10-CM | POA: Diagnosis not present

## 2013-02-06 DIAGNOSIS — M999 Biomechanical lesion, unspecified: Secondary | ICD-10-CM | POA: Diagnosis not present

## 2013-02-06 DIAGNOSIS — M5030 Other cervical disc degeneration, unspecified cervical region: Secondary | ICD-10-CM | POA: Diagnosis not present

## 2013-02-06 DIAGNOSIS — M545 Low back pain: Secondary | ICD-10-CM | POA: Diagnosis not present

## 2013-02-06 DIAGNOSIS — M5137 Other intervertebral disc degeneration, lumbosacral region: Secondary | ICD-10-CM | POA: Diagnosis not present

## 2013-02-15 DIAGNOSIS — M5030 Other cervical disc degeneration, unspecified cervical region: Secondary | ICD-10-CM | POA: Diagnosis not present

## 2013-02-15 DIAGNOSIS — M542 Cervicalgia: Secondary | ICD-10-CM | POA: Diagnosis not present

## 2013-02-15 DIAGNOSIS — M545 Low back pain: Secondary | ICD-10-CM | POA: Diagnosis not present

## 2013-02-15 DIAGNOSIS — M5137 Other intervertebral disc degeneration, lumbosacral region: Secondary | ICD-10-CM | POA: Diagnosis not present

## 2013-02-15 DIAGNOSIS — M999 Biomechanical lesion, unspecified: Secondary | ICD-10-CM | POA: Diagnosis not present

## 2013-02-15 DIAGNOSIS — M9981 Other biomechanical lesions of cervical region: Secondary | ICD-10-CM | POA: Diagnosis not present

## 2013-02-27 DIAGNOSIS — M5030 Other cervical disc degeneration, unspecified cervical region: Secondary | ICD-10-CM | POA: Diagnosis not present

## 2013-02-27 DIAGNOSIS — M9981 Other biomechanical lesions of cervical region: Secondary | ICD-10-CM | POA: Diagnosis not present

## 2013-02-27 DIAGNOSIS — M545 Low back pain: Secondary | ICD-10-CM | POA: Diagnosis not present

## 2013-02-27 DIAGNOSIS — M999 Biomechanical lesion, unspecified: Secondary | ICD-10-CM | POA: Diagnosis not present

## 2013-02-27 DIAGNOSIS — M542 Cervicalgia: Secondary | ICD-10-CM | POA: Diagnosis not present

## 2013-02-27 DIAGNOSIS — M5137 Other intervertebral disc degeneration, lumbosacral region: Secondary | ICD-10-CM | POA: Diagnosis not present

## 2013-03-22 DIAGNOSIS — M545 Low back pain: Secondary | ICD-10-CM | POA: Diagnosis not present

## 2013-03-22 DIAGNOSIS — M5137 Other intervertebral disc degeneration, lumbosacral region: Secondary | ICD-10-CM | POA: Diagnosis not present

## 2013-03-22 DIAGNOSIS — M5030 Other cervical disc degeneration, unspecified cervical region: Secondary | ICD-10-CM | POA: Diagnosis not present

## 2013-03-22 DIAGNOSIS — M9981 Other biomechanical lesions of cervical region: Secondary | ICD-10-CM | POA: Diagnosis not present

## 2013-03-22 DIAGNOSIS — M999 Biomechanical lesion, unspecified: Secondary | ICD-10-CM | POA: Diagnosis not present

## 2013-03-22 DIAGNOSIS — M542 Cervicalgia: Secondary | ICD-10-CM | POA: Diagnosis not present

## 2013-05-03 DIAGNOSIS — M5030 Other cervical disc degeneration, unspecified cervical region: Secondary | ICD-10-CM | POA: Diagnosis not present

## 2013-05-03 DIAGNOSIS — M5137 Other intervertebral disc degeneration, lumbosacral region: Secondary | ICD-10-CM | POA: Diagnosis not present

## 2013-05-03 DIAGNOSIS — M545 Low back pain, unspecified: Secondary | ICD-10-CM | POA: Diagnosis not present

## 2013-05-03 DIAGNOSIS — M999 Biomechanical lesion, unspecified: Secondary | ICD-10-CM | POA: Diagnosis not present

## 2013-05-04 DIAGNOSIS — M5030 Other cervical disc degeneration, unspecified cervical region: Secondary | ICD-10-CM | POA: Diagnosis not present

## 2013-05-04 DIAGNOSIS — M999 Biomechanical lesion, unspecified: Secondary | ICD-10-CM | POA: Diagnosis not present

## 2013-05-04 DIAGNOSIS — M545 Low back pain, unspecified: Secondary | ICD-10-CM | POA: Diagnosis not present

## 2013-05-04 DIAGNOSIS — M5137 Other intervertebral disc degeneration, lumbosacral region: Secondary | ICD-10-CM | POA: Diagnosis not present

## 2013-05-16 ENCOUNTER — Telehealth: Payer: Self-pay | Admitting: Internal Medicine

## 2013-05-16 NOTE — Telephone Encounter (Signed)
Patient Information:  Caller Name: Calea  Phone: [phone]  Patient: Tiffany, Reed  Gender: Female  DOB: [birth year]  Age: 73 Years  PCP: Bluford Kaufmann (Family Practice > 27yrs old)  Office Follow Up:  Does the office need to follow up with this patient?: No  Instructions For The Office: N/A   Symptoms  Reason For Call & Symptoms: Pt calling regarding cough and chest congestion. Was worse at first 05/04/13, but cough is "hanging on".   Reviewed Health History In EMR: Yes  Reviewed Medications In EMR: Yes  Reviewed Allergies In EMR: Yes  Reviewed Surgeries / Procedures: Yes  Date of Onset of Symptoms: 05/04/2013  Treatments Tried: Mucinex  Treatments Tried Worked: Yes  Guideline(s) Used:  Cough  Disposition Per Guideline:   See Within 3 Days in Office  Reason For Disposition Reached:   Cough has been present for > 10 days  Advice Given:  Coughing Spasms:  Drink warm fluids. Inhale warm mist (Reason: both relax the airway and loosen up the phlegm).  Suck on cough drops or hard candy to coat the irritated throat.  Prevent Dehydration:  Drink adequate liquids.  This will help soothe an irritated or dry throat and loosen up the phlegm.  Expected Course:   The expected course depends on what is causing the cough.  Viral bronchitis (chest cold) causes a cough that lasts 1 to 3 weeks. Sometimes you may cough up lots of phlegm (sputum, mucus). The mucus can normally be white, gray, yellow, or green.  Call Back If:  Difficulty breathing  Cough lasts more than 3 weeks  Fever lasts > 3 days  You become worse.  Patient Will Follow Care Advice:  YES

## 2013-05-16 NOTE — Telephone Encounter (Signed)
FYI

## 2013-05-19 ENCOUNTER — Ambulatory Visit (INDEPENDENT_AMBULATORY_CARE_PROVIDER_SITE_OTHER): Payer: Medicare Other | Admitting: Family

## 2013-05-19 ENCOUNTER — Encounter: Payer: Self-pay | Admitting: Family

## 2013-05-19 VITALS — BP 110/74 | HR 71 | Temp 98.9°F | Wt 137.0 lb

## 2013-05-19 DIAGNOSIS — N63 Unspecified lump in unspecified breast: Secondary | ICD-10-CM | POA: Diagnosis not present

## 2013-05-19 DIAGNOSIS — R05 Cough: Secondary | ICD-10-CM | POA: Diagnosis not present

## 2013-05-19 DIAGNOSIS — R059 Cough, unspecified: Secondary | ICD-10-CM | POA: Diagnosis not present

## 2013-05-19 DIAGNOSIS — J209 Acute bronchitis, unspecified: Secondary | ICD-10-CM | POA: Diagnosis not present

## 2013-05-19 DIAGNOSIS — N6320 Unspecified lump in the left breast, unspecified quadrant: Secondary | ICD-10-CM

## 2013-05-19 MED ORDER — METHYLPREDNISOLONE 4 MG PO KIT: PACK | ORAL | Status: AC

## 2013-05-19 MED ORDER — METHYLPREDNISOLONE ACETATE 40 MG/ML IJ SUSP
80.0000 mg | Freq: Once | INTRAMUSCULAR | Status: AC
  Administered 2013-05-19: 80 mg via INTRAMUSCULAR

## 2013-05-19 NOTE — Progress Notes (Signed)
Pre visit review using our clinic review tool, if applicable. No additional management support is needed unless otherwise documented below in the visit note. 

## 2013-05-19 NOTE — Telephone Encounter (Signed)
Noted  

## 2013-05-19 NOTE — Patient Instructions (Signed)

## 2013-05-19 NOTE — Progress Notes (Signed)
Subjective:    Patient ID: Tiffany Reed, female    DOB: [birth year], 73 y.o.   MRN: [id number]  HPI 73 year old white female, nonsmoker, patient of Dr. Raliegh Ip. is in today with complaints of a cough x3 weeks that's nonproductive and not responding well to Mucinex.   Review of Systems  Constitutional: Negative.   HENT: Negative.   Respiratory: Negative.   Cardiovascular: Negative.   Gastrointestinal: Negative.   Endocrine: Negative.   Genitourinary: Negative.   Musculoskeletal: Negative.   Allergic/Immunologic: Negative.   Neurological: Negative.   Psychiatric/Behavioral: Negative.    Past Medical History  Diagnosis Date  . Breast cancer 2005    right, s/p xrt  . Hypertension   . Hypothyroidism   . Fluttering heart     and racing over the past several years with lightheadedness  . H/O urinary incontinence   . Dysrhythmia     had halter monitor 4/13-all ok for palpatations    History   Social History  . Marital Status: Married    Spouse Name: N/A    Number of Children: N/A  . Years of Education: N/A   Occupational History  . Not on file.   Social History Main Topics  . Smoking status: Never Smoker   . Smokeless tobacco: Never Used  . Alcohol Use: Yes     Comment: rare  . Drug Use: No  . Sexual Activity: Not on file   Other Topics Concern  . Not on file   Social History Narrative   Pt lives in [place].   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          Past Surgical History  Procedure Laterality Date  . Breast lumpectomy  2005    right  . Ganglion cyst excision  2005    left thumb  . Hysteroscopy  [id number]  . Thyroid lobectomy  2004  . Neuroplasty / transposition median nerve at carpal tunnel bilateral  2002  . Plantar fibroma      4th toe, right foot  . Excision morton's neuroma  2001    left foot  . Hemorroidectomy  1973  . Pilonidal cyst / sinus excision  1968  . Hammer toe surgery  2012    right 5th toe  . Ear cyst  excision Left 06/15/2012    Procedure: Left scaphoid cyst excision with distal radius graft;  Surgeon: Wynonia Sours, MD;  Location: [facility];  Service: Orthopedics;  Laterality: Left;  EXCISION CYST LEFT SCAPHOID DISTAL RADIUS GRAFT    Family History  Problem Relation Age of Onset  . Colon cancer Neg Hx   . Stomach cancer Neg Hx     Allergies  Allergen Reactions  . Sorbitol     Gas, diarrhea  . Penicillins Rash    Current Outpatient Prescriptions on File Prior to Visit  Medication Sig Dispense Refill  . hydrochlorothiazide (MICROZIDE) 12.5 MG capsule TAKE 1 TABLET (12.5 MG TOTAL) BY MOUTH DAILY.  90 capsule  3  . SYNTHROID 75 MCG tablet Take 1 tablet (75 mcg total) by mouth daily.  90 tablet  3   No current facility-administered medications on file prior to visit.    BP 110/74  Pulse 71  Temp(Src) 98.9 F (37.2 C) (Oral)  Wt 137 lb (62.143 kg)  SpO2 96%chart    Objective:   Physical Exam  Constitutional: She is oriented to person, place, and time. She appears well-developed and  well-nourished.  Neck: Normal range of motion. Neck supple.  Cardiovascular: Normal rate, regular rhythm and normal heart sounds.   Pulmonary/Chest: Effort normal and breath sounds normal.  Abdominal: Soft. Bowel sounds are normal.  Musculoskeletal: Normal range of motion.  Neurological: She is alert and oriented to person, place, and time.  Skin: Skin is warm and dry.  Psychiatric: She has a normal mood and affect.          Assessment & Plan:  Assessment: 1. Acute bronchitis 2. Cough-Medrol Dosepak as directed. Over-the-counter Robitussin as needed for cough 3. Left breast mass-diagnostic left mammogram. Order placed, but notify patient with appointment

## 2013-05-19 NOTE — Telephone Encounter (Signed)
Patient Information:  Caller Name: Mikella  Phone: [phone]  Patient: Tiffany Reed, Tiffany Reed  Gender: Female  DOB: 07-19-1940  Age: 73 Years  PCP: Bluford Kaufmann (Family Practice > 57yrs old)  Office Follow Up:  Does the office need to follow up with this patient?: Yes  Instructions For The Office: Had to schedule late appt.  Patient is in the bathtub while she is talking to the nurse and she lives 40 miles away.   Symptoms  Reason For Call & Symptoms: She has been sick for 3 weeks.  No fever. She has had night sweats. She has a terrible cough and chest congestion. She is not bringing up a lot of mucous.  Reviewed Health History In EMR: Yes  Reviewed Medications In EMR: Yes  Reviewed Allergies In EMR: Yes  Reviewed Surgeries / Procedures: Yes  Date of Onset of Symptoms: 04/28/2013  Treatments Tried: Warm baths, Nyquil, Mucinex  Treatments Tried Worked: No  Guideline(s) Used:  Cough  Disposition Per Guideline:   See Today or Tomorrow in Office  Reason For Disposition Reached:   Continuous (nonstop) coughing interferes with work or school and no improvement using cough treatment per Care Advice  Advice Given:  N/A  Patient Will Follow Care Advice:  YES  Appointment Scheduled:  05/19/2013 15:30:00 Appointment Scheduled Provider:  Roxy Cedar (Family Practice)

## 2013-05-22 ENCOUNTER — Telehealth: Payer: Self-pay | Admitting: Internal Medicine

## 2013-05-22 NOTE — Telephone Encounter (Signed)
Spoke to pt told her Dr. Raliegh Ip is out of the office and will be back tomorrow. I can not call in codeine cough syrup will need to be seen by another provider today or wait till Dr.K reviews note tomorrow. Pt said she will wait till tomorrow. Told her okay will call her tomorrow.

## 2013-05-22 NOTE — Telephone Encounter (Signed)
Patient Information:  Caller Name: Delylah  Phone: [phone]  Patient: Tiffany Reed, Tiffany Reed  Gender: Female  DOB: 12-22-1940  Age: 73 Years  PCP: Bluford Kaufmann (Family Practice > 68yrs old)  Office Follow Up:  Does the office need to follow up with this patient?: Yes  Instructions For The Office: She is requesting cough medicine be called in AND she is regulating her own HCTZ based on if she feels she is "hydrated:", please advise on medication compliance.  RN Note:  Afebrile. OV follow up from 05/19/2013 diagnosed with Bronchitis. She has better energy, feels the cough is breaking up and doing much better but the coughing is interrupting ADL's. She is trying all home remedies: steaming bath, cough drops and requesting Cough Medicince with Codeine. Also, she took herself off her HCTZ for 3+ days since she was coughing and dehydrated and then restarted herself back on it after going on prednisone, which she felt would increase her fluid. RN/CAN advised not doing without telling the Dr. [facility].  Symptoms  Reason For Call & Symptoms: OV follow up from 05/15/2013,NP [facility]. Prednisone and Mucinex for Bronchitis. The coughing is constant.  Reviewed Health History In EMR: Yes  Reviewed Medications In EMR: Yes  Reviewed Allergies In EMR: Yes  Reviewed Surgeries / Procedures: Yes  Date of Onset of Symptoms: 04/18/2013  Treatments Tried: prednisone  Treatments Tried Worked: No  Guideline(s) Used:  Cough  Disposition Per Guideline:   See Today or Tomorrow in Office  Reason For Disposition Reached:   Continuous (nonstop) coughing interferes with work or school and no improvement using cough treatment per Care Advice  Advice Given:  Reassurance  Coughing is the way that our lungs remove irritants and mucus. It helps protect our lungs from getting pneumonia.  You can get a dry hacking cough after a chest cold. Sometimes this type of cough can last  1-3 weeks, and be worse at night.  Cough Medicines:  OTC Cough Drops: Cough drops can help a lot, especially for mild coughs. They reduce coughing by soothing your irritated throat and removing that tickle sensation in the back of the throat. Cough drops also have the advantage of portability - you can carry them with you.  Coughing Spasms:  Drink warm fluids. Inhale warm mist (Reason: both relax the airway and loosen up the phlegm).  Suck on cough drops or hard candy to coat the irritated throat.  Prevent Dehydration:  Drink adequate liquids.  This will help soothe an irritated or dry throat and loosen up the phlegm.  Avoid Tobacco Smoke:  Smoking or being exposed to smoke makes coughs much worse.  RN Overrode Recommendation:  Patient Requests Prescription  She is requestting cough medication with [facility]

## 2013-05-22 NOTE — Telephone Encounter (Signed)
Please advise 

## 2013-05-23 MED ORDER — GUAIFENESIN-CODEINE 200-10 MG/5ML PO LIQD: 5.0000 mL | Freq: Four times a day (QID) | ORAL | Status: DC | PRN

## 2013-05-23 NOTE — Telephone Encounter (Signed)
Dr. K, please see message and advise. 

## 2013-05-23 NOTE — Telephone Encounter (Signed)
Spoke to pt told her Rx for cough syrup is ready for pickup will be at the front desk. Pt verbalized understanding. Rx printed and signed by Dr. Burnice Logan.

## 2013-05-23 NOTE — Telephone Encounter (Signed)
Okay for guaifenesin/codeine cough medication 6 ounce 1 teaspoon every 6 hours as needed for cough

## 2013-05-24 ENCOUNTER — Other Ambulatory Visit: Payer: Self-pay | Admitting: Family

## 2013-05-24 DIAGNOSIS — N6320 Unspecified lump in the left breast, unspecified quadrant: Secondary | ICD-10-CM

## 2013-06-14 ENCOUNTER — Telehealth: Payer: Self-pay

## 2013-06-14 NOTE — Telephone Encounter (Signed)
Left message on both contact number for pt to call back concerning mammogram

## 2013-06-14 NOTE — Telephone Encounter (Signed)
Pt states that she will schedule it tomorrow. She has not gotten it because she has been sick with bronchitis

## 2013-06-28 ENCOUNTER — Ambulatory Visit
Admission: RE | Admit: 2013-06-28 | Discharge: 2013-06-28 | Disposition: A | Discharge status: Home / Self Care | Payer: Medicare Other | Source: Ambulatory Visit | Attending: Family | Admitting: Family

## 2013-06-28 ENCOUNTER — Other Ambulatory Visit: Payer: Self-pay | Admitting: Family

## 2013-06-28 DIAGNOSIS — N6320 Unspecified lump in the left breast, unspecified quadrant: Secondary | ICD-10-CM

## 2013-08-02 ENCOUNTER — Other Ambulatory Visit: Payer: Self-pay | Admitting: Family

## 2013-08-02 ENCOUNTER — Ambulatory Visit
Admission: RE | Admit: 2013-08-02 | Discharge: 2013-08-02 | Disposition: A | Discharge status: Home / Self Care | Payer: Medicare Other | Source: Ambulatory Visit | Attending: Family | Admitting: Family

## 2013-08-02 DIAGNOSIS — Z853 Personal history of malignant neoplasm of breast: Secondary | ICD-10-CM | POA: Diagnosis not present

## 2013-08-02 DIAGNOSIS — N6320 Unspecified lump in the left breast, unspecified quadrant: Secondary | ICD-10-CM

## 2013-08-02 DIAGNOSIS — N6489 Other specified disorders of breast: Secondary | ICD-10-CM | POA: Diagnosis not present

## 2013-09-13 DIAGNOSIS — M545 Low back pain, unspecified: Secondary | ICD-10-CM | POA: Diagnosis not present

## 2013-09-13 DIAGNOSIS — M5030 Other cervical disc degeneration, unspecified cervical region: Secondary | ICD-10-CM | POA: Diagnosis not present

## 2013-09-13 DIAGNOSIS — M999 Biomechanical lesion, unspecified: Secondary | ICD-10-CM | POA: Diagnosis not present

## 2013-09-13 DIAGNOSIS — M5137 Other intervertebral disc degeneration, lumbosacral region: Secondary | ICD-10-CM | POA: Diagnosis not present

## 2013-09-14 DIAGNOSIS — M5030 Other cervical disc degeneration, unspecified cervical region: Secondary | ICD-10-CM | POA: Diagnosis not present

## 2013-09-14 DIAGNOSIS — M545 Low back pain, unspecified: Secondary | ICD-10-CM | POA: Diagnosis not present

## 2013-09-14 DIAGNOSIS — M999 Biomechanical lesion, unspecified: Secondary | ICD-10-CM | POA: Diagnosis not present

## 2013-09-14 DIAGNOSIS — M5137 Other intervertebral disc degeneration, lumbosacral region: Secondary | ICD-10-CM | POA: Diagnosis not present

## 2013-09-20 DIAGNOSIS — M999 Biomechanical lesion, unspecified: Secondary | ICD-10-CM | POA: Diagnosis not present

## 2013-09-20 DIAGNOSIS — M5137 Other intervertebral disc degeneration, lumbosacral region: Secondary | ICD-10-CM | POA: Diagnosis not present

## 2013-09-20 DIAGNOSIS — M5030 Other cervical disc degeneration, unspecified cervical region: Secondary | ICD-10-CM | POA: Diagnosis not present

## 2013-09-20 DIAGNOSIS — M545 Low back pain, unspecified: Secondary | ICD-10-CM | POA: Diagnosis not present

## 2013-09-21 DIAGNOSIS — M5137 Other intervertebral disc degeneration, lumbosacral region: Secondary | ICD-10-CM | POA: Diagnosis not present

## 2013-09-21 DIAGNOSIS — M545 Low back pain, unspecified: Secondary | ICD-10-CM | POA: Diagnosis not present

## 2013-09-21 DIAGNOSIS — M5030 Other cervical disc degeneration, unspecified cervical region: Secondary | ICD-10-CM | POA: Diagnosis not present

## 2013-09-21 DIAGNOSIS — M999 Biomechanical lesion, unspecified: Secondary | ICD-10-CM | POA: Diagnosis not present

## 2013-09-25 DIAGNOSIS — M5137 Other intervertebral disc degeneration, lumbosacral region: Secondary | ICD-10-CM | POA: Diagnosis not present

## 2013-09-25 DIAGNOSIS — M999 Biomechanical lesion, unspecified: Secondary | ICD-10-CM | POA: Diagnosis not present

## 2013-09-25 DIAGNOSIS — M545 Low back pain, unspecified: Secondary | ICD-10-CM | POA: Diagnosis not present

## 2013-09-25 DIAGNOSIS — M5030 Other cervical disc degeneration, unspecified cervical region: Secondary | ICD-10-CM | POA: Diagnosis not present

## 2013-09-28 DIAGNOSIS — M5137 Other intervertebral disc degeneration, lumbosacral region: Secondary | ICD-10-CM | POA: Diagnosis not present

## 2013-09-28 DIAGNOSIS — M545 Low back pain, unspecified: Secondary | ICD-10-CM | POA: Diagnosis not present

## 2013-09-28 DIAGNOSIS — M999 Biomechanical lesion, unspecified: Secondary | ICD-10-CM | POA: Diagnosis not present

## 2013-09-28 DIAGNOSIS — M5030 Other cervical disc degeneration, unspecified cervical region: Secondary | ICD-10-CM | POA: Diagnosis not present

## 2013-10-03 DIAGNOSIS — M5030 Other cervical disc degeneration, unspecified cervical region: Secondary | ICD-10-CM | POA: Diagnosis not present

## 2013-10-03 DIAGNOSIS — M545 Low back pain, unspecified: Secondary | ICD-10-CM | POA: Diagnosis not present

## 2013-10-03 DIAGNOSIS — M5137 Other intervertebral disc degeneration, lumbosacral region: Secondary | ICD-10-CM | POA: Diagnosis not present

## 2013-10-03 DIAGNOSIS — M999 Biomechanical lesion, unspecified: Secondary | ICD-10-CM | POA: Diagnosis not present

## 2013-10-18 DIAGNOSIS — M545 Low back pain, unspecified: Secondary | ICD-10-CM | POA: Diagnosis not present

## 2013-10-18 DIAGNOSIS — M999 Biomechanical lesion, unspecified: Secondary | ICD-10-CM | POA: Diagnosis not present

## 2013-10-18 DIAGNOSIS — M5137 Other intervertebral disc degeneration, lumbosacral region: Secondary | ICD-10-CM | POA: Diagnosis not present

## 2013-10-18 DIAGNOSIS — M5030 Other cervical disc degeneration, unspecified cervical region: Secondary | ICD-10-CM | POA: Diagnosis not present

## 2013-12-08 DIAGNOSIS — M79609 Pain in unspecified limb: Secondary | ICD-10-CM | POA: Diagnosis not present

## 2013-12-08 DIAGNOSIS — I872 Venous insufficiency (chronic) (peripheral): Secondary | ICD-10-CM | POA: Diagnosis not present

## 2013-12-12 DIAGNOSIS — I872 Venous insufficiency (chronic) (peripheral): Secondary | ICD-10-CM | POA: Diagnosis not present

## 2013-12-12 DIAGNOSIS — M79609 Pain in unspecified limb: Secondary | ICD-10-CM | POA: Diagnosis not present

## 2013-12-26 ENCOUNTER — Telehealth: Payer: Self-pay | Admitting: Internal Medicine

## 2013-12-26 NOTE — Telephone Encounter (Signed)
Patient Information:  Caller Name: [name]  Phone: [phone]  Patient: Tiffany Reed, Tiffany Reed  Gender: Female  DOB: [birth year]  Age: 73 Years  PCP: Bluford Kaufmann (Family Practice > 78yrs old)  Office Follow Up:  Does the office need to follow up with this patient?: No  Instructions For The Office: N/A   Symptoms  Reason For Call & Symptoms: Diarrhea for over 2 weeks but does not remember the exact date of onset.  No cramping or pain, stools loose, more frequently at first, now 1-2 in 24 hours.  However has had 2 brown soft mushy stools this morning 9/8.  Some stools are very urgent, has soiled underware.  Trying to add bread to diet but not helping.  Thinking back has added Viramin D, Magnesium 3 tabs, Attolin, Chromium,  Glocamine Condroitant, and other suppliments to diet about thie same time. Also -- will be going out of coiuntry to [place] next week for a month - will need some meds renewed early before trip.as well as diarrhea controlled.  Reviewed Health History In EMR: Yes  Reviewed Medications In EMR: Yes  Reviewed Allergies In EMR: Yes  Reviewed Surgeries / Procedures: Yes  Date of Onset of Symptoms: Unknown  Guideline(s) Used:  Diarrhea  Disposition Per Guideline:   See Within 3 Days in Office  Reason For Disposition Reached:   Diarrhea persists > 7 days  Advice Given:  Nutrition:  Maintaining some food intake during episodes of diarrhea is important.  Ideal initial foods include boiled starches/cereals (e.g., potatoes, rice, noodles, wheat, oats) with a small amount of salt to taste.  Other acceptable foods include: bananas, yogurt, crackers, soup.  Expected Course:  Viral diarrhea lasts 4-7 days. Always worse on days 1 and 2.  Patient Will Follow Care Advice:  YES  Appointment Scheduled:  12/28/2013 09:00:00 Appointment Scheduled Provider:  Garret Reddish

## 2013-12-26 NOTE — Telephone Encounter (Signed)
Noted  

## 2013-12-26 NOTE — Telephone Encounter (Signed)
Looks like a FYI 

## 2013-12-27 ENCOUNTER — Encounter: Payer: Self-pay | Admitting: Gastroenterology

## 2013-12-28 ENCOUNTER — Ambulatory Visit: Payer: Self-pay | Admitting: Family Medicine

## 2013-12-28 DIAGNOSIS — H269 Unspecified cataract: Secondary | ICD-10-CM | POA: Diagnosis not present

## 2014-01-09 IMAGING — MG MM DIGITAL DIAGNOSTIC BILAT
4 series · 4 of 4 positions shown · non-contrast
Comparison: With priors

CLINICAL DATA: History of right breast cancer, status post
lumpectomy in 1771.  Annual examination.  The patient is
asymptomatic.

DIGITAL DIAGNOSTIC BILATERAL MAMMOGRAM WITH CAD

[R CC]
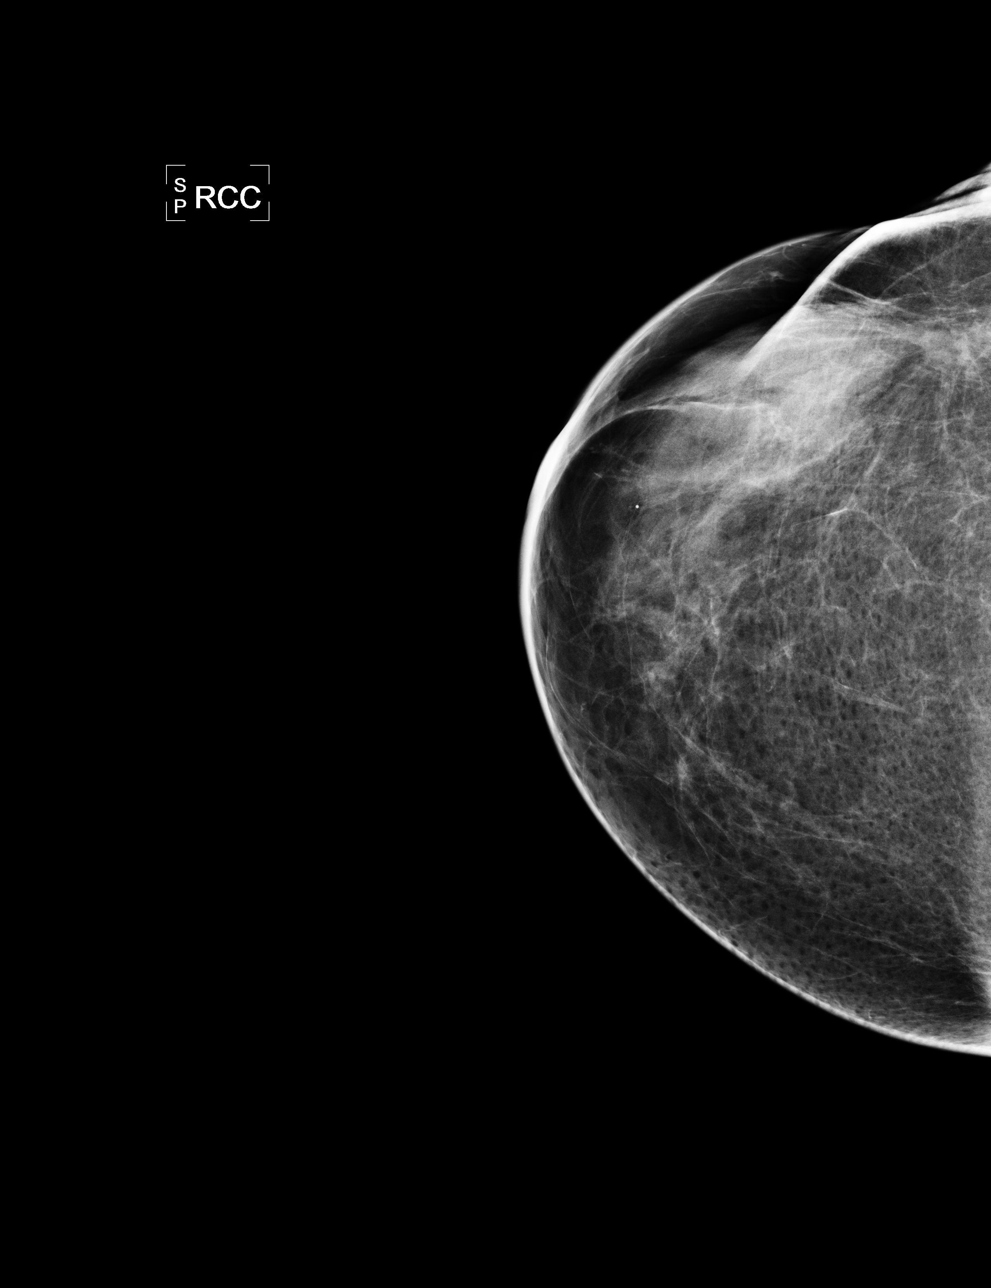

[L CC]
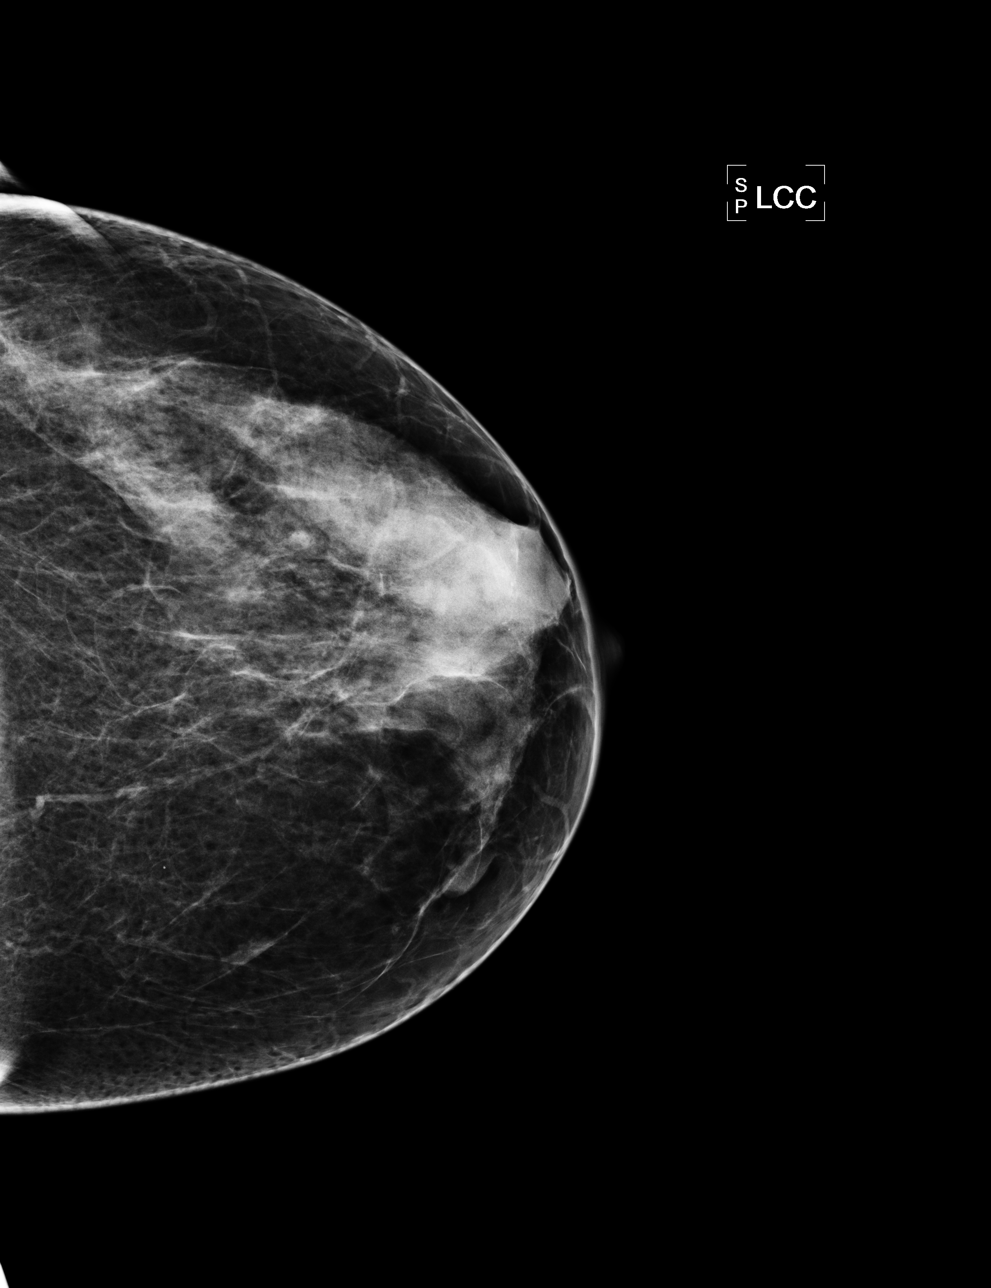

[L MLO]
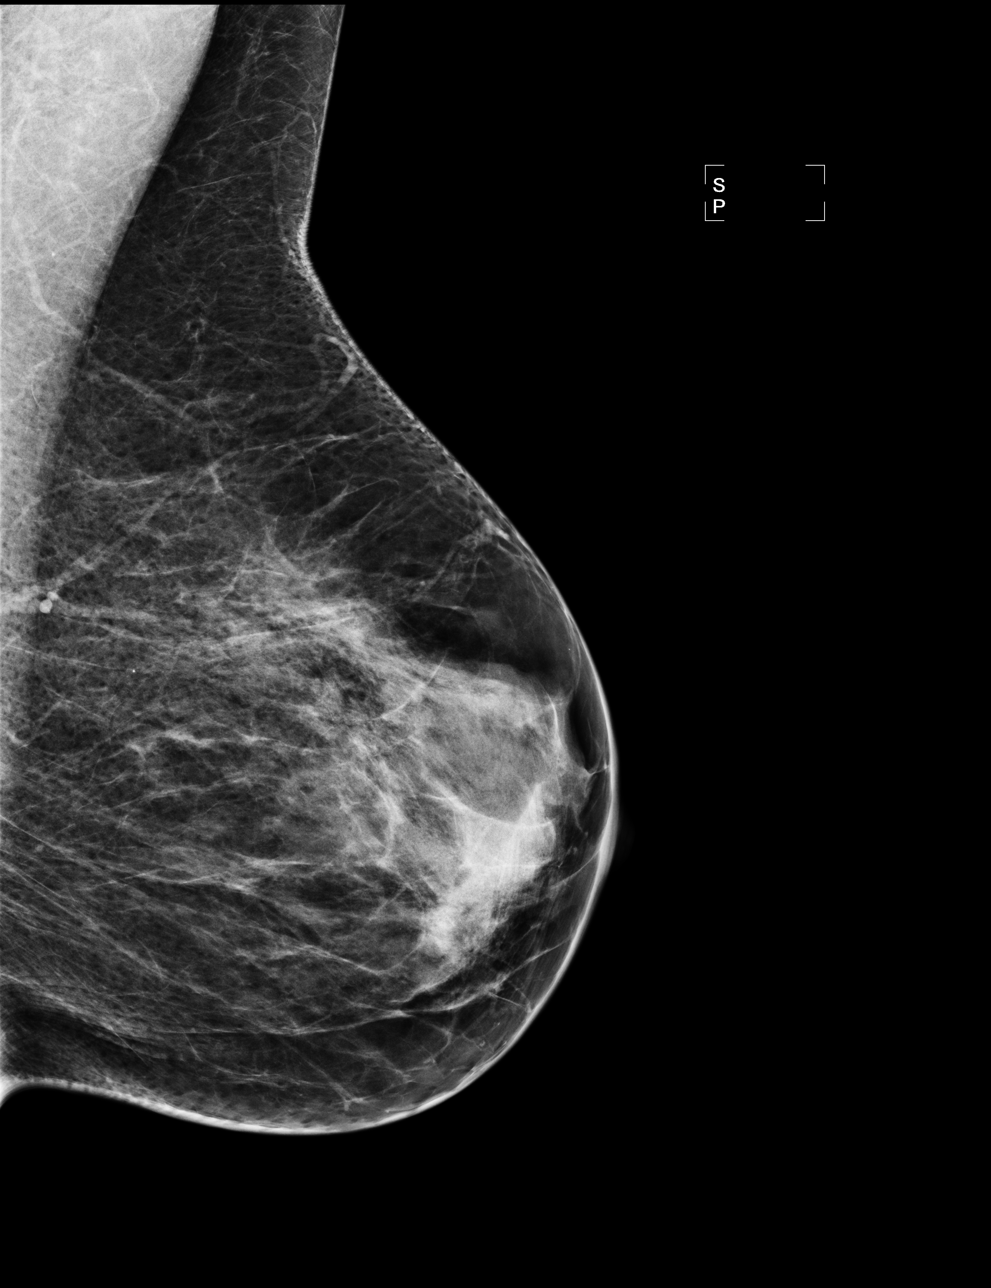

[R MLO]
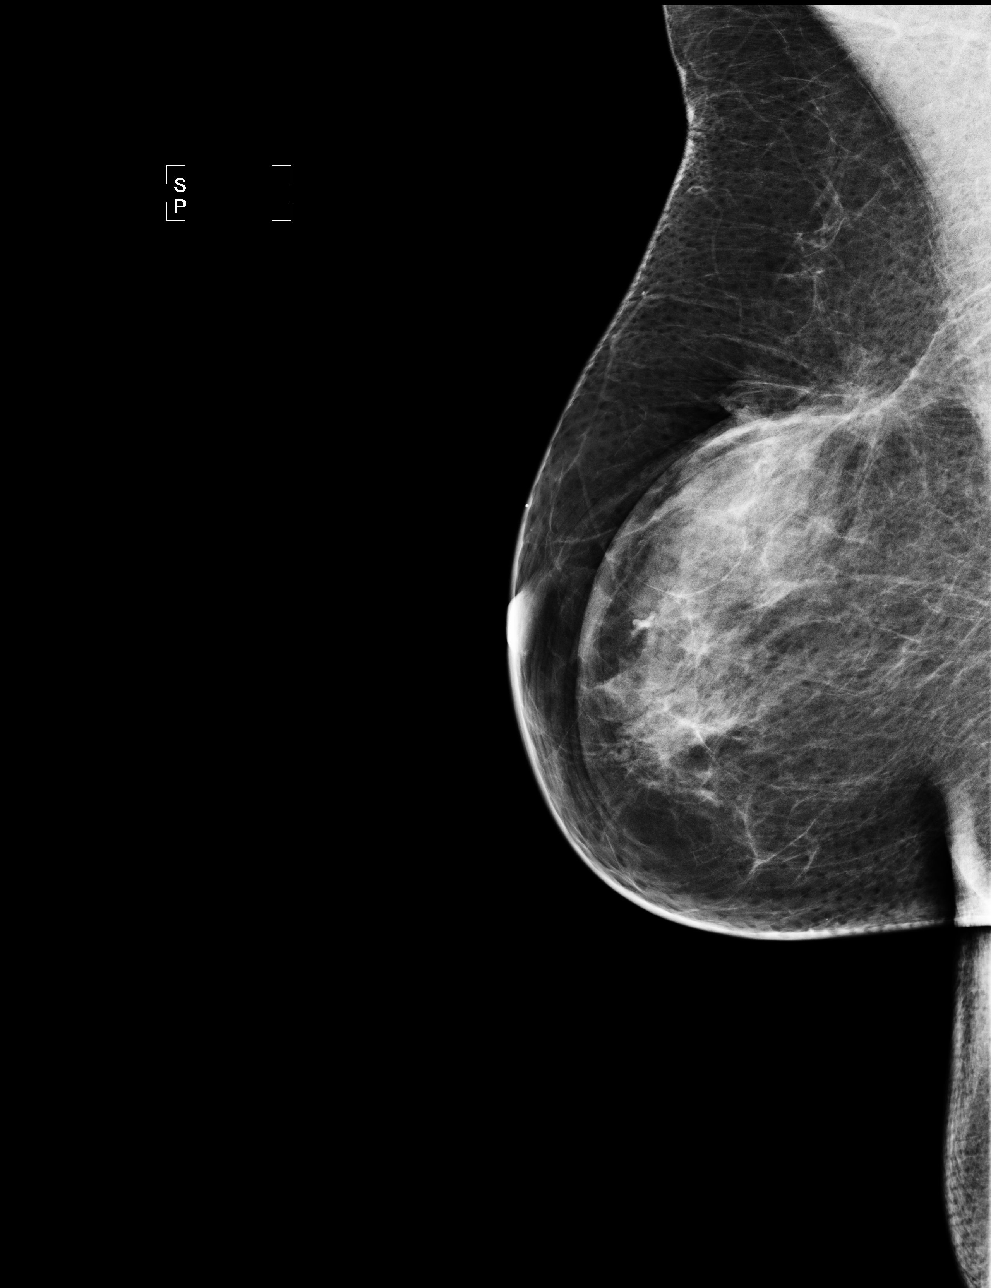

[4 of 4 positions shown; findings below may reference images not displayed]

FINDINGS: The breast parenchyma is heterogeneously dense
bilaterally.  There are stable lumpectomy changes in the deep upper
outer right breast.  No mass, nonsurgical distortion, or suspicious
microcalcification is identified in either breast to suggest
malignancy.

Mammographic images were processed with CAD.
IMPRESSION: No evidence of malignancy in either breast.  Lumpectomy changes on
the right.  Bilateral screening mammogram in 1 year is recommended.

BI-RADS CATEGORY 2:  Benign finding(s).

## 2014-01-16 ENCOUNTER — Other Ambulatory Visit: Payer: Self-pay | Admitting: Internal Medicine

## 2014-01-26 ENCOUNTER — Other Ambulatory Visit: Payer: Self-pay | Admitting: Internal Medicine

## 2014-02-05 DIAGNOSIS — M79652 Pain in left thigh: Secondary | ICD-10-CM | POA: Diagnosis not present

## 2014-02-05 DIAGNOSIS — I8312 Varicose veins of left lower extremity with inflammation: Secondary | ICD-10-CM | POA: Diagnosis not present

## 2014-02-05 DIAGNOSIS — M79605 Pain in left leg: Secondary | ICD-10-CM | POA: Diagnosis not present

## 2014-02-21 DIAGNOSIS — M79652 Pain in left thigh: Secondary | ICD-10-CM | POA: Diagnosis not present

## 2014-02-21 DIAGNOSIS — I8312 Varicose veins of left lower extremity with inflammation: Secondary | ICD-10-CM | POA: Diagnosis not present

## 2014-02-21 DIAGNOSIS — M79605 Pain in left leg: Secondary | ICD-10-CM | POA: Diagnosis not present

## 2014-03-07 ENCOUNTER — Ambulatory Visit: Payer: Medicare Other

## 2014-03-07 ENCOUNTER — Ambulatory Visit (INDEPENDENT_AMBULATORY_CARE_PROVIDER_SITE_OTHER): Payer: Medicare Other

## 2014-03-07 DIAGNOSIS — Z23 Encounter for immunization: Secondary | ICD-10-CM

## 2014-03-07 DIAGNOSIS — M79604 Pain in right leg: Secondary | ICD-10-CM | POA: Diagnosis not present

## 2014-03-07 DIAGNOSIS — M79651 Pain in right thigh: Secondary | ICD-10-CM | POA: Diagnosis not present

## 2014-03-07 DIAGNOSIS — I8311 Varicose veins of right lower extremity with inflammation: Secondary | ICD-10-CM | POA: Diagnosis not present

## 2014-03-21 DIAGNOSIS — M79651 Pain in right thigh: Secondary | ICD-10-CM | POA: Diagnosis not present

## 2014-03-21 DIAGNOSIS — M79604 Pain in right leg: Secondary | ICD-10-CM | POA: Diagnosis not present

## 2014-03-21 DIAGNOSIS — I8311 Varicose veins of right lower extremity with inflammation: Secondary | ICD-10-CM | POA: Diagnosis not present

## 2014-03-21 DIAGNOSIS — M7981 Nontraumatic hematoma of soft tissue: Secondary | ICD-10-CM | POA: Diagnosis not present

## 2014-04-22 ENCOUNTER — Other Ambulatory Visit: Payer: Self-pay | Admitting: Internal Medicine

## 2014-06-28 DIAGNOSIS — Z0000 Encounter for general adult medical examination without abnormal findings: Secondary | ICD-10-CM | POA: Diagnosis not present

## 2014-06-29 ENCOUNTER — Other Ambulatory Visit: Payer: Self-pay

## 2014-06-29 DIAGNOSIS — Z1231 Encounter for screening mammogram for malignant neoplasm of breast: Secondary | ICD-10-CM

## 2014-07-11 DIAGNOSIS — M1812 Unilateral primary osteoarthritis of first carpometacarpal joint, left hand: Secondary | ICD-10-CM | POA: Diagnosis not present

## 2014-07-11 DIAGNOSIS — M1811 Unilateral primary osteoarthritis of first carpometacarpal joint, right hand: Secondary | ICD-10-CM | POA: Diagnosis not present

## 2014-07-19 ENCOUNTER — Other Ambulatory Visit: Payer: Self-pay | Admitting: Internal Medicine

## 2014-08-06 ENCOUNTER — Ambulatory Visit
Admission: RE | Admit: 2014-08-06 | Discharge: 2014-08-06 | Disposition: A | Discharge status: Home / Self Care | Payer: Medicare Other | Source: Ambulatory Visit

## 2014-08-06 DIAGNOSIS — Z1231 Encounter for screening mammogram for malignant neoplasm of breast: Secondary | ICD-10-CM

## 2014-08-15 DIAGNOSIS — M1812 Unilateral primary osteoarthritis of first carpometacarpal joint, left hand: Secondary | ICD-10-CM | POA: Diagnosis not present

## 2014-08-15 DIAGNOSIS — M1811 Unilateral primary osteoarthritis of first carpometacarpal joint, right hand: Secondary | ICD-10-CM | POA: Diagnosis not present

## 2014-08-28 DIAGNOSIS — K117 Disturbances of salivary secretion: Secondary | ICD-10-CM | POA: Diagnosis not present

## 2014-10-16 ENCOUNTER — Other Ambulatory Visit: Payer: Self-pay | Admitting: Internal Medicine

## 2014-10-24 ENCOUNTER — Telehealth: Payer: Self-pay | Admitting: Internal Medicine

## 2014-10-24 NOTE — Telephone Encounter (Signed)
Pt needs refill on synthroid 75 mcg #90 w/refills sent to [facility]. Pt has a cpx sch for 12-18-14

## 2014-10-25 MED ORDER — SYNTHROID 75 MCG PO TABS: ORAL_TABLET | ORAL | Status: DC

## 2014-10-25 NOTE — Telephone Encounter (Signed)
Rx sent to pharmacy   

## 2014-11-15 ENCOUNTER — Other Ambulatory Visit: Payer: Self-pay | Admitting: Obstetrics and Gynecology

## 2014-11-15 DIAGNOSIS — Z6824 Body mass index (BMI) 24.0-24.9, adult: Secondary | ICD-10-CM | POA: Diagnosis not present

## 2014-11-15 DIAGNOSIS — Z124 Encounter for screening for malignant neoplasm of cervix: Secondary | ICD-10-CM | POA: Diagnosis not present

## 2014-11-16 LAB — CYTOLOGY - PAP

## 2014-11-19 ENCOUNTER — Other Ambulatory Visit: Payer: Self-pay | Admitting: Obstetrics and Gynecology

## 2014-11-19 DIAGNOSIS — E041 Nontoxic single thyroid nodule: Secondary | ICD-10-CM

## 2014-11-20 ENCOUNTER — Ambulatory Visit
Admission: RE | Admit: 2014-11-20 | Discharge: 2014-11-20 | Disposition: A | Discharge status: Home / Self Care | Payer: Medicare Other | Source: Ambulatory Visit | Attending: Obstetrics and Gynecology | Admitting: Obstetrics and Gynecology

## 2014-11-20 DIAGNOSIS — E041 Nontoxic single thyroid nodule: Secondary | ICD-10-CM

## 2014-12-18 ENCOUNTER — Encounter: Payer: Self-pay | Admitting: Internal Medicine

## 2014-12-18 ENCOUNTER — Ambulatory Visit (INDEPENDENT_AMBULATORY_CARE_PROVIDER_SITE_OTHER): Payer: Medicare Other | Admitting: Internal Medicine

## 2014-12-18 VITALS — BP 140/88 | HR 68 | Temp 98.2°F | Resp 20 | Ht 61.5 in | Wt 139.0 lb

## 2014-12-18 DIAGNOSIS — E2839 Other primary ovarian failure: Secondary | ICD-10-CM

## 2014-12-18 DIAGNOSIS — C50911 Malignant neoplasm of unspecified site of right female breast: Secondary | ICD-10-CM

## 2014-12-18 DIAGNOSIS — E038 Other specified hypothyroidism: Secondary | ICD-10-CM | POA: Diagnosis not present

## 2014-12-18 DIAGNOSIS — Z23 Encounter for immunization: Secondary | ICD-10-CM | POA: Diagnosis not present

## 2014-12-18 DIAGNOSIS — E034 Atrophy of thyroid (acquired): Secondary | ICD-10-CM | POA: Diagnosis not present

## 2014-12-18 DIAGNOSIS — I10 Essential (primary) hypertension: Secondary | ICD-10-CM | POA: Diagnosis not present

## 2014-12-18 DIAGNOSIS — Z0000 Encounter for general adult medical examination without abnormal findings: Secondary | ICD-10-CM

## 2014-12-18 MED ORDER — SYNTHROID 75 MCG PO TABS: ORAL_TABLET | ORAL | Status: DC

## 2014-12-18 MED ORDER — HYDROCHLOROTHIAZIDE 12.5 MG PO CAPS: ORAL_CAPSULE | ORAL | Status: DC

## 2014-12-18 NOTE — Progress Notes (Signed)
Pre visit review using our clinic review tool, if applicable. No additional management support is needed unless otherwise documented below in the visit note. 

## 2014-12-18 NOTE — Patient Instructions (Signed)
Limit your sodium (Salt) intake    It is important that you exercise regularly, at least 20 minutes 3 to 4 times per week.  If you develop chest pain or shortness of breath seek  medical attention.  You need to lose weight.  Consider a lower calorie diet and regular exercise.  Please check your blood pressure on a regular basis.  If it is consistently greater than 150/90, please make an office appointment.   Bone density study as discussed  Take a calcium supplement, plus [phone] units of vitamin D  Health Maintenance Adopting a healthy lifestyle and getting preventive care can go a long way to promote health and wellness. Talk with your health care provider about what schedule of regular examinations is right for you. This is a good chance for you to check in with your provider about disease prevention and staying healthy. In between checkups, there are plenty of things you can do on your own. Experts have done a lot of research about which lifestyle changes and preventive measures are most likely to keep you healthy. Ask your health care provider for more information. WEIGHT AND DIET  Eat a healthy diet  Be sure to include plenty of vegetables, fruits, low-fat dairy products, and lean protein.  Do not eat a lot of foods high in solid fats, added sugars, or salt.  Get regular exercise. This is one of the most important things you can do for your health.  Most adults should exercise for at least 150 minutes each week. The exercise should increase your heart rate and make you sweat (moderate-intensity exercise).  Most adults should also do strengthening exercises at least twice a week. This is in addition to the moderate-intensity exercise.  Maintain a healthy weight  Body mass index (BMI) is a measurement that can be used to identify possible weight problems. It estimates body fat based on height and weight. Your health care provider can help determine your BMI and help you achieve or  maintain a healthy weight.  For females 42 years of age and older:   A BMI below 18.5 is considered underweight.  A BMI of 18.5 to 24.9 is normal.  A BMI of 25 to 29.9 is considered overweight.  A BMI of 30 and above is considered obese.  Watch levels of cholesterol and blood lipids  You should start having your blood tested for lipids and cholesterol at 72 years of age, then have this test every 5 years.  You may need to have your cholesterol levels checked more often if:  Your lipid or cholesterol levels are high.  You are older than 72 years of age.  You are at high risk for heart disease.  CANCER SCREENING   Lung Cancer  Lung cancer screening is recommended for adults 36-51 years old who are at high risk for lung cancer because of a history of smoking.  A yearly low-dose CT scan of the lungs is recommended for people who:  Currently smoke.  Have quit within the past 15 years.  Have at least a 30-pack-year history of smoking. A pack year is smoking an average of one pack of cigarettes a day for 1 year.  Yearly screening should continue until it has been 15 years since you quit.  Yearly screening should stop if you develop a health problem that would prevent you from having lung cancer treatment.  Breast Cancer  Practice breast self-awareness. This means understanding how your breasts normally appear and feel.  It also means doing regular breast self-exams. Let your health care provider know about any changes, no matter how small.  If you are in your 20s or 30s, you should have a clinical breast exam (CBE) by a health care provider every 1-3 years as part of a regular health exam.  If you are 48 or older, have a CBE every year. Also consider having a breast X-ray (mammogram) every year.  If you have a family history of breast cancer, talk to your health care provider about genetic screening.  If you are at high risk for breast cancer, talk to your health care  provider about having an MRI and a mammogram every year.  Breast cancer gene (BRCA) assessment is recommended for women who have family members with BRCA-related cancers. BRCA-related cancers include:  Breast.  Ovarian.  Tubal.  Peritoneal cancers.  Results of the assessment will determine the need for genetic counseling and BRCA1 and BRCA2 testing. Cervical Cancer Routine pelvic examinations to screen for cervical cancer are no longer recommended for nonpregnant women who are considered low risk for cancer of the pelvic organs (ovaries, uterus, and vagina) and who do not have symptoms. A pelvic examination may be necessary if you have symptoms including those associated with pelvic infections. Ask your health care provider if a screening pelvic exam is right for you.   The Pap test is the screening test for cervical cancer for women who are considered at risk.  If you had a hysterectomy for a problem that was not cancer or a condition that could lead to cancer, then you no longer need Pap tests.  If you are older than 65 years, and you have had normal Pap tests for the past 10 years, you no longer need to have Pap tests.  If you have had past treatment for cervical cancer or a condition that could lead to cancer, you need Pap tests and screening for cancer for at least 20 years after your treatment.  If you no longer get a Pap test, assess your risk factors if they change (such as having a new sexual partner). This can affect whether you should start being screened again.  Some women have medical problems that increase their chance of getting cervical cancer. If this is the case for you, your health care provider may recommend more frequent screening and Pap tests.  The human papillomavirus (HPV) test is another test that may be used for cervical cancer screening. The HPV test looks for the virus that can cause cell changes in the cervix. The cells collected during the Pap test can be  tested for HPV.  The HPV test can be used to screen women 64 years of age and older. Getting tested for HPV can extend the interval between normal Pap tests from three to five years.  An HPV test also should be used to screen women of any age who have unclear Pap test results.  After 72 years of age, women should have HPV testing as often as Pap tests.  Colorectal Cancer  This type of cancer can be detected and often prevented.  Routine colorectal cancer screening usually begins at 72 years of age and continues through 72 years of age.  Your health care provider may recommend screening at an earlier age if you have risk factors for colon cancer.  Your health care provider may also recommend using home test kits to check for hidden blood in the stool.  A small camera at the  end of a tube can be used to examine your colon directly (sigmoidoscopy or colonoscopy). This is done to check for the earliest forms of colorectal cancer.  Routine screening usually begins at age 10.  Direct examination of the colon should be repeated every 5-10 years through 72 years of age. However, you may need to be screened more often if early forms of precancerous polyps or small growths are found. Skin Cancer  Check your skin from head to toe regularly.  Tell your health care provider about any new moles or changes in moles, especially if there is a change in a mole's shape or color.  Also tell your health care provider if you have a mole that is larger than the size of a pencil eraser.  Always use sunscreen. Apply sunscreen liberally and repeatedly throughout the day.  Protect yourself by wearing long sleeves, pants, a wide-brimmed hat, and sunglasses whenever you are outside. HEART DISEASE, DIABETES, AND HIGH BLOOD PRESSURE   Have your blood pressure checked at least every 1-2 years. High blood pressure causes heart disease and increases the risk of stroke.  If you are between 39 years and 61 years  old, ask your health care provider if you should take aspirin to prevent strokes.  Have regular diabetes screenings. This involves taking a blood sample to check your fasting blood sugar level.  If you are at a normal weight and have a low risk for diabetes, have this test once every three years after 72 years of age.  If you are overweight and have a high risk for diabetes, consider being tested at a younger age or more often. PREVENTING INFECTION  Hepatitis B  If you have a higher risk for hepatitis B, you should be screened for this virus. You are considered at high risk for hepatitis B if:  You were born in a country where hepatitis B is common. Ask your health care provider which countries are considered high risk.  Your parents were born in a high-risk country, and you have not been immunized against hepatitis B (hepatitis B vaccine).  You have HIV or AIDS.  You use needles to inject street drugs.  You live with someone who has hepatitis B.  You have had sex with someone who has hepatitis B.  You get hemodialysis treatment.  You take certain medicines for conditions, including cancer, organ transplantation, and autoimmune conditions. Hepatitis C  Blood testing is recommended for:  Everyone born from [birth year] through 1963.  Anyone with known risk factors for hepatitis C. Sexually transmitted infections (STIs)  You should be screened for sexually transmitted infections (STIs) including gonorrhea and chlamydia if:  You are sexually active and are younger than 72 years of age.  You are older than 72 years of age and your health care provider tells you that you are at risk for this type of infection.  Your sexual activity has changed since you were last screened and you are at an increased risk for chlamydia or gonorrhea. Ask your health care provider if you are at risk.  If you do not have HIV, but are at risk, it may be recommended that you take a prescription medicine  daily to prevent HIV infection. This is called pre-exposure prophylaxis (PrEP). You are considered at risk if:  You are sexually active and do not regularly use condoms or know the HIV status of your partner(s).  You take drugs by injection.  You are sexually active with a partner who has  HIV. Talk with your health care provider about whether you are at high risk of being infected with HIV. If you choose to begin PrEP, you should first be tested for HIV. You should then be tested every 3 months for as long as you are taking PrEP.  PREGNANCY   If you are premenopausal and you may become pregnant, ask your health care provider about preconception counseling.  If you may become pregnant, take 400 to 800 micrograms (mcg) of folic acid every day.  If you want to prevent pregnancy, talk to your health care provider about birth control (contraception). OSTEOPOROSIS AND MENOPAUSE   Osteoporosis is a disease in which the bones lose minerals and strength with aging. This can result in serious bone fractures. Your risk for osteoporosis can be identified using a bone density scan.  If you are 75 years of age or older, or if you are at risk for osteoporosis and fractures, ask your health care provider if you should be screened.  Ask your health care provider whether you should take a calcium or vitamin D supplement to lower your risk for osteoporosis.  Menopause may have certain physical symptoms and risks.  Hormone replacement therapy may reduce some of these symptoms and risks. Talk to your health care provider about whether hormone replacement therapy is right for you.  HOME CARE INSTRUCTIONS   Schedule regular health, dental, and eye exams.  Stay current with your immunizations.   Do not use any tobacco products including cigarettes, chewing tobacco, or electronic cigarettes.  If you are pregnant, do not drink alcohol.  If you are breastfeeding, limit how much and how often you drink  alcohol.  Limit alcohol intake to no more than 1 drink per day for nonpregnant women. One drink equals 12 ounces of beer, 5 ounces of wine, or 1 ounces of hard liquor.  Do not use street drugs.  Do not share needles.  Ask your health care provider for help if you need support or information about quitting drugs.  Tell your health care provider if you often feel depressed.  Tell your health care provider if you have ever been abused or do not feel safe at home. Document Released: 10/20/2010 Document Revised: 08/21/2013 Document Reviewed: 03/08/2013 [facility] Patient Information 2015 [place], [place]. This information is not intended to replace advice given to you by your health care provider. Make sure you discuss any questions you have with your health care provider.

## 2014-12-18 NOTE — Progress Notes (Signed)
Subjective:    Patient ID: Tiffany Reed, female    DOB: 1940/07/23, 74 y.o.   MRN: [id number]  HPI 74 -year-old patient who is seen today for a wellness exam. She has remote history of breast cancer status post resection in 2005. She has hypertension hypothyroidism and a history of osteopenia. She has seen cardiology recently due  to palpitations. Evaluation included a 2-D echocardiogram.   she also has a history of osteopenia. Last bone density study 2014 Last colonoscopy 2013  Has had a recent OB/GYN evaluation with mammogram Has had recent laboratory screening.  The revealed a LDL cholesterol of 143 with an HDL cholesterol of 86 The patient has a prior history of multinodular goiter status post partial resection.  She has been self referred to Dr. Suzette Battiest         Past Medical History  Diagnosis Date  . Breast cancer 2005    right, s/p xrt  . Hypertension   . Hypothyroidism   . Fluttering heart     and racing over the past several years with lightheadedness  . H/O urinary incontinence   . Dysrhythmia     had halter monitor 4/13-all ok for palpatations    Social History   Social History  . Marital Status: Married    Spouse Name: N/A  . Number of Children: N/A  . Years of Education: N/A   Occupational History  . Not on file.   Social History Main Topics  . Smoking status: Never Smoker   . Smokeless tobacco: Never Used  . Alcohol Use: Yes     Comment: rare  . Drug Use: No  . Sexual Activity: Not on file   Other Topics Concern  . Not on file   Social History Narrative   Pt lives in [place].   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          Past Surgical History  Procedure Laterality Date  . Breast lumpectomy  2005    right  . Ganglion cyst excision  2005    left thumb  . Hysteroscopy  [id number]  . Thyroid lobectomy  2004  . Neuroplasty / transposition median nerve at carpal tunnel bilateral  2002  . Plantar fibroma      4th toe,  right foot  . Excision morton's neuroma  2001    left foot  . Hemorroidectomy  1973  . Pilonidal cyst / sinus excision  1968  . Hammer toe surgery  2012    right 5th toe  . Ear cyst excision Left 06/15/2012    Procedure: Left scaphoid cyst excision with distal radius graft;  Surgeon: Wynonia Sours, MD;  Location: [facility];  Service: Orthopedics;  Laterality: Left;  EXCISION CYST LEFT SCAPHOID DISTAL RADIUS GRAFT    Family History  Problem Relation Age of Onset  . Colon cancer Neg Hx   . Stomach cancer Neg Hx     Allergies  Allergen Reactions  . Sorbitol     Gas, diarrhea  . Penicillins Rash    Current Outpatient Prescriptions on File Prior to Visit  Medication Sig Dispense Refill  . hydrochlorothiazide (MICROZIDE) 12.5 MG capsule TAKE 1 TABLET (12.5 MG TOTAL) BY MOUTH DAILY. 90 capsule 3  . SYNTHROID 75 MCG tablet TAKE 1 TABLET (75 MCG TOTAL) BY MOUTH DAILY. 90 tablet 0   No current facility-administered medications on file prior to visit.    BP 140/88 mmHg  Pulse 68  Temp(Src) 98.2 F (36.8 C) (Oral)  Resp 20  Ht 5' 1.5" (1.562 m)  Wt 139 lb (63.05 kg)  BMI 25.84 kg/m2  SpO2 97%   1. Risk factors, based on past  M,S,F history-  CV risk  Factors include HTN  2.  Physical activities: no restrictions  3.  Depression/mood: neg  4.  Hearing: neg  5.  ADL's: independent  6.  Fall risk: low  7.  Home safety: no problems  8.  Height weight, and visual acuity; h/o decr ht  9.  Counseling: exercise , vit D  10. Lab orders based on risk factors:  today  11. Referral : DEXA follow-up  12. Care plan: exercise; consider osteporosis rx.  Continue aggressive risk factor modification.  Modest weight loss encouraged  13. Cognitive assessment:  Alert; appropriate without cognitive dysfunction  14.  Preventive health screening.  Will include annual primary care evaluation as well as annual gynecologic visits.  The patient will have a follow-up bone  density study in view of her osteopenia.  Patient will be considered for a follow-up colonoscopy in 7 years  17.  Provider list includes primary care and OB/GYN in endocrinology, ophthalmology, cardiology and GI       Review of Systems  Constitutional: Negative for fever, appetite change, fatigue and unexpected weight change.  HENT: Negative for congestion, dental problem, ear pain, hearing loss, mouth sores, nosebleeds, sinus pressure, sore throat, tinnitus, trouble swallowing and voice change.   Eyes: Negative for photophobia, pain, redness and visual disturbance.  Respiratory: Negative for cough, chest tightness and shortness of breath.   Cardiovascular: Negative for chest pain, palpitations and leg swelling.  Gastrointestinal: Negative for nausea, vomiting, abdominal pain, diarrhea, constipation, blood in stool, abdominal distention and rectal pain.  Genitourinary: Negative for dysuria, urgency, frequency, hematuria, flank pain, vaginal bleeding, vaginal discharge, difficulty urinating, genital sores, vaginal pain, menstrual problem and pelvic pain.  Musculoskeletal: Negative for back pain, arthralgias and neck stiffness.  Skin: Negative for rash.  Neurological: Negative for dizziness, syncope, speech difficulty, weakness, light-headedness, numbness and headaches.  Hematological: Negative for adenopathy. Does not bruise/bleed easily.  Psychiatric/Behavioral: Negative for suicidal ideas, behavioral problems, self-injury, dysphoric mood and agitation. The patient is not nervous/anxious.        Objective:   Physical Exam  Constitutional: She is oriented to person, place, and time. She appears well-developed and well-nourished.  HENT:  Head: Normocephalic and atraumatic.  Right Ear: External ear normal.  Left Ear: External ear normal.  Mouth/Throat: Oropharynx is clear and moist.  Eyes: Conjunctivae and EOM are normal.  Neck: Normal range of motion. Neck supple. No JVD present. No  thyromegaly present.  Partial thyroidectomy scar  Cardiovascular: Normal rate, regular rhythm, normal heart sounds and intact distal pulses.   No murmur heard. Pedal pulses faint and not definitely palpable on the left  Pulmonary/Chest: Effort normal and breath sounds normal. She has no wheezes. She has no rales.  Right partial mastectomy  Abdominal: Soft. Bowel sounds are normal. She exhibits no distension and no mass. There is no tenderness. There is no rebound and no guarding.  Genitourinary: Vagina normal.  Musculoskeletal: Normal range of motion. She exhibits no edema or tenderness.  Neurological: She is alert and oriented to person, place, and time. She has normal reflexes. No cranial nerve deficit. She exhibits normal muscle tone. Coordination normal.  Skin: Skin is warm and dry. No rash noted.  Psychiatric: She has a normal mood and  affect. Her behavior is normal.          Assessment & Plan:   Preventive health examination  Hypertension stable  Hypothyroidism   Osteopenia- follow-up DEXA scan  Laboratory studies including lipid profile will be reviewed   Please check your blood pressure on a regular basis.  If it is consistently greater than 150/90, please make an office appointment.

## 2014-12-19 NOTE — Addendum Note (Signed)
Addended by: Marian Sorrow on: 12/19/2014 02:13 PM   Modules accepted: Medications

## 2015-01-02 ENCOUNTER — Ambulatory Visit (INDEPENDENT_AMBULATORY_CARE_PROVIDER_SITE_OTHER)
Admission: RE | Admit: 2015-01-02 | Discharge: 2015-01-02 | Disposition: A | Discharge status: Home / Self Care | Payer: Medicare Other | Source: Ambulatory Visit | Attending: Internal Medicine | Admitting: Internal Medicine

## 2015-01-02 DIAGNOSIS — E2839 Other primary ovarian failure: Secondary | ICD-10-CM

## 2015-01-07 DIAGNOSIS — E890 Postprocedural hypothyroidism: Secondary | ICD-10-CM | POA: Diagnosis not present

## 2015-01-07 DIAGNOSIS — E041 Nontoxic single thyroid nodule: Secondary | ICD-10-CM | POA: Diagnosis not present

## 2015-01-08 ENCOUNTER — Other Ambulatory Visit: Payer: Self-pay | Admitting: Endocrinology

## 2015-01-08 DIAGNOSIS — E041 Nontoxic single thyroid nodule: Secondary | ICD-10-CM

## 2015-01-20 ENCOUNTER — Other Ambulatory Visit: Payer: Self-pay | Admitting: Internal Medicine

## 2015-05-08 DIAGNOSIS — H52223 Regular astigmatism, bilateral: Secondary | ICD-10-CM | POA: Diagnosis not present

## 2015-05-08 DIAGNOSIS — H5211 Myopia, right eye: Secondary | ICD-10-CM | POA: Diagnosis not present

## 2015-05-08 DIAGNOSIS — H2513 Age-related nuclear cataract, bilateral: Secondary | ICD-10-CM | POA: Diagnosis not present

## 2015-05-08 DIAGNOSIS — H524 Presbyopia: Secondary | ICD-10-CM | POA: Diagnosis not present

## 2015-05-08 DIAGNOSIS — H5202 Hypermetropia, left eye: Secondary | ICD-10-CM | POA: Diagnosis not present

## 2015-07-02 ENCOUNTER — Other Ambulatory Visit: Payer: Medicare Other

## 2015-07-02 DIAGNOSIS — E890 Postprocedural hypothyroidism: Secondary | ICD-10-CM | POA: Diagnosis not present

## 2015-07-09 DIAGNOSIS — E041 Nontoxic single thyroid nodule: Secondary | ICD-10-CM | POA: Diagnosis not present

## 2015-07-09 DIAGNOSIS — E890 Postprocedural hypothyroidism: Secondary | ICD-10-CM | POA: Diagnosis not present

## 2015-07-10 ENCOUNTER — Other Ambulatory Visit: Payer: Self-pay | Admitting: Endocrinology

## 2015-07-15 ENCOUNTER — Ambulatory Visit
Admission: RE | Admit: 2015-07-15 | Discharge: 2015-07-15 | Disposition: A | Discharge status: Home / Self Care | Payer: Medicare Other | Source: Ambulatory Visit | Attending: Endocrinology | Admitting: Endocrinology

## 2015-07-15 DIAGNOSIS — E041 Nontoxic single thyroid nodule: Secondary | ICD-10-CM

## 2015-09-10 ENCOUNTER — Encounter: Payer: Self-pay | Admitting: Internal Medicine

## 2015-09-10 ENCOUNTER — Ambulatory Visit: Payer: Medicare Other | Admitting: Internal Medicine

## 2015-09-10 ENCOUNTER — Ambulatory Visit (INDEPENDENT_AMBULATORY_CARE_PROVIDER_SITE_OTHER): Payer: Medicare Other | Admitting: Internal Medicine

## 2015-09-10 VITALS — BP 140/72 | HR 45 | Temp 97.9°F | Resp 18 | Ht 61.5 in | Wt 149.0 lb

## 2015-09-10 DIAGNOSIS — S93105A Unspecified dislocation of left toe(s), initial encounter: Secondary | ICD-10-CM

## 2015-09-10 DIAGNOSIS — I10 Essential (primary) hypertension: Secondary | ICD-10-CM

## 2015-09-10 NOTE — Patient Instructions (Signed)
Call or return to clinic prn if these symptoms worsen or fail to improve as anticipated.

## 2015-09-10 NOTE — Progress Notes (Signed)
Pre visit review using our clinic review tool, if applicable. No additional management support is needed unless otherwise documented below in the visit note. 

## 2015-09-10 NOTE — Progress Notes (Signed)
Subjective:    Patient ID: Tiffany Reed, female    DOB: 1940-10-25, 75 y.o.   MRN: [id number]  HPI  75 year old patient who has essential hypertension.  3 days ago, she jammed her left fifth toe against a chair.  She states this was initially dislocated at a 45 angle that she realigned.  She has had mild pain over the weekend.  She states that she had a similar episode twice.  Involving the right fifth toe that eventually required surgery to repair a chronic dislocation. Past Medical History  Diagnosis Date  . Breast cancer ([place]) 2005    right, s/p xrt  . Hypertension   . Hypothyroidism   . Fluttering heart ([place])     and racing over the past several years with lightheadedness  . H/O urinary incontinence   . Dysrhythmia     had halter monitor 4/13-all ok for palpatations     Social History   Social History  . Marital Status: Married    Spouse Name: N/A  . Number of Children: N/A  . Years of Education: N/A   Occupational History  . Not on file.   Social History Main Topics  . Smoking status: Never Smoker   . Smokeless tobacco: Never Used  . Alcohol Use: Yes     Comment: rare  . Drug Use: No  . Sexual Activity: Not on file   Other Topics Concern  . Not on file   Social History Narrative   Pt lives in [place].   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          Past Surgical History  Procedure Laterality Date  . Breast lumpectomy  2005    right  . Ganglion cyst excision  2005    left thumb  . Hysteroscopy  [id number]  . Thyroid lobectomy  2004  . Neuroplasty / transposition median nerve at carpal tunnel bilateral  2002  . Plantar fibroma      4th toe, right foot  . Excision morton's neuroma  2001    left foot  . Hemorroidectomy  1973  . Pilonidal cyst / sinus excision  1968  . Hammer toe surgery  2012    right 5th toe  . Ear cyst excision Left 06/15/2012    Procedure: Left scaphoid cyst excision with distal radius graft;  Surgeon:  Wynonia Sours, MD;  Location: [facility];  Service: Orthopedics;  Laterality: Left;  EXCISION CYST LEFT SCAPHOID DISTAL RADIUS GRAFT    Family History  Problem Relation Age of Onset  . Colon cancer Neg Hx   . Stomach cancer Neg Hx     Allergies  Allergen Reactions  . Sorbitol     Gas, diarrhea  . Penicillins Rash    Current Outpatient Prescriptions on File Prior to Visit  Medication Sig Dispense Refill  . Cholecalciferol (VITAMIN D3) 5000 UNITS TABS Take 1 tablet by mouth 2 (two) times a week.    . hydrochlorothiazide (MICROZIDE) 12.5 MG capsule TAKE 1 TABLET (12.5 MG TOTAL) BY MOUTH DAILY. 90 capsule 3  . IRON, FERROUS GLUCONATE, PO Take 22 mg by mouth 2 (two) times a week.    Marland Kitchen OVER THE COUNTER MEDICATION Vitamins A - 2500 IU / D-300 IU / E-100 IU / Riboflavin-5 mg / Niacin-30 mg / Iodine-200 mcg / Magnesium-15 mg / Zinc-5 mg / Selenium-30 mcg / Copper-0.1 mg / Manganese-2 mg Takes Daily    .  Probiotic Product (PROBIOTIC DAILY PO) Take by mouth. Jarrow-Dophilus EPS, 5 billion per capsule, one capsule Twice Weekly    . SYNTHROID 75 MCG tablet TAKE 1 TABLET (75 MCG TOTAL) BY MOUTH DAILY. 90 tablet 3  . vitamin B-12 (CYANOCOBALAMIN) 500 MCG tablet Take 500 mcg by mouth 2 (two) times a week.    . vitamin C (ASCORBIC ACID) 250 MG tablet Take 250 mg by mouth 2 (two) times a week.     No current facility-administered medications on file prior to visit.    BP 140/72 mmHg  Pulse 45  Temp(Src) 97.9 F (36.6 C) (Oral)  Resp 18  Ht 5' 1.5" (1.562 m)  Wt 149 lb (67.586 kg)  BMI 27.70 kg/m2  SpO2 99%       Review of Systems  Constitutional: Negative.   HENT: Negative for congestion, dental problem, hearing loss, rhinorrhea, sinus pressure, sore throat and tinnitus.   Eyes: Negative for pain, discharge and visual disturbance.  Respiratory: Negative for cough and shortness of breath.   Cardiovascular: Negative for chest pain, palpitations and leg swelling.    Gastrointestinal: Negative for nausea, vomiting, abdominal pain, diarrhea, constipation, blood in stool and abdominal distention.  Genitourinary: Negative for dysuria, urgency, frequency, hematuria, flank pain, vaginal bleeding, vaginal discharge, difficulty urinating, vaginal pain and pelvic pain.  Musculoskeletal: Negative for joint swelling, arthralgias and gait problem.       Posttraumatic left fifth toe discomfort  Skin: Negative for rash.  Neurological: Negative for dizziness, syncope, speech difficulty, weakness, numbness and headaches.  Hematological: Negative for adenopathy.  Psychiatric/Behavioral: Negative for behavioral problems, dysphoric mood and agitation. The patient is not nervous/anxious.        Objective:   Physical Exam  Constitutional: She appears well-developed and well-nourished. No distress.  Blood pressure 130/70  Musculoskeletal:  The left toe was minimally edematous.  Normal flexion and extension of the toes.  Slight tenderness;  normal alignment          Assessment & Plan:  Contusion/dislocation left fifth toe.  The toe is now properly aligned.  X-ray evaluation discussed, but not felt to be useful clinically.  Local foot care discussed.  She report any new or worsening symptoms   Nyoka Cowden, MD

## 2015-09-18 ENCOUNTER — Ambulatory Visit: Payer: Medicare Other | Admitting: Podiatry

## 2015-10-16 ENCOUNTER — Ambulatory Visit (INDEPENDENT_AMBULATORY_CARE_PROVIDER_SITE_OTHER): Payer: Medicare Other

## 2015-10-16 ENCOUNTER — Encounter: Payer: Self-pay | Admitting: Podiatry

## 2015-10-16 ENCOUNTER — Ambulatory Visit (INDEPENDENT_AMBULATORY_CARE_PROVIDER_SITE_OTHER): Payer: Medicare Other | Admitting: Podiatry

## 2015-10-16 VITALS — BP 117/73 | HR 64 | Resp 16

## 2015-10-16 DIAGNOSIS — M79675 Pain in left toe(s): Secondary | ICD-10-CM

## 2015-10-16 DIAGNOSIS — S92912A Unspecified fracture of left toe(s), initial encounter for closed fracture: Secondary | ICD-10-CM

## 2015-10-16 NOTE — Progress Notes (Signed)
   Subjective:    Patient ID: Tiffany Reed, female    DOB: 22-Feb-1941, 75 y.o.   MRN: [id number]  HPI    Review of Systems  All other systems reviewed and are negative.      Objective:   Physical Exam        Assessment & Plan:

## 2015-10-16 NOTE — Progress Notes (Signed)
Subjective:     Patient ID: Tiffany Reed, female   DOB: 05/28/1940, 75 y.o.   MRN: [id number]  HPI patient presents stating that she's had a lot of pain in her left fifth toe and thinks that she broke it but it is still remained sore after 6 weeks   Review of Systems  All other systems reviewed and are negative.      Objective:   Physical Exam  Constitutional: She is oriented to person, place, and time.  Cardiovascular: Intact distal pulses.   Musculoskeletal: Normal range of motion.  Neurological: She is oriented to person, place, and time.  Skin: Skin is warm.  Nursing note and vitals reviewed.  neurovascular status intact muscle strength adequate range of motion within normal limits with patient noted to have edema and pain of the left fifth digit that is present with no indications of instability of the toe at this time. She has good digital perfusion and is well oriented 3     Assessment:     Probable fracture of the left fifth toe with edema present    Plan:     Reviewed x-rays and recommended that this patient continue conservative care and if it's not better in 6 weeks to reevaluate. Patient will be seen back at that time for reevaluation  X-ray report indicates proximal fracture digit 5 left is not going through the lateral cortex

## 2015-10-23 DIAGNOSIS — I8312 Varicose veins of left lower extremity with inflammation: Secondary | ICD-10-CM | POA: Diagnosis not present

## 2015-10-23 DIAGNOSIS — I8311 Varicose veins of right lower extremity with inflammation: Secondary | ICD-10-CM | POA: Diagnosis not present

## 2015-11-06 DIAGNOSIS — M79605 Pain in left leg: Secondary | ICD-10-CM | POA: Diagnosis not present

## 2015-11-06 DIAGNOSIS — I8312 Varicose veins of left lower extremity with inflammation: Secondary | ICD-10-CM | POA: Diagnosis not present

## 2015-11-06 DIAGNOSIS — M79604 Pain in right leg: Secondary | ICD-10-CM | POA: Diagnosis not present

## 2015-11-06 DIAGNOSIS — I8311 Varicose veins of right lower extremity with inflammation: Secondary | ICD-10-CM | POA: Diagnosis not present

## 2015-11-13 DIAGNOSIS — R600 Localized edema: Secondary | ICD-10-CM | POA: Diagnosis not present

## 2015-11-13 DIAGNOSIS — I87323 Chronic venous hypertension (idiopathic) with inflammation of bilateral lower extremity: Secondary | ICD-10-CM | POA: Diagnosis not present

## 2015-12-27 ENCOUNTER — Other Ambulatory Visit: Payer: Self-pay | Admitting: Internal Medicine

## 2016-01-03 ENCOUNTER — Ambulatory Visit (INDEPENDENT_AMBULATORY_CARE_PROVIDER_SITE_OTHER): Payer: Medicare Other | Admitting: *Deleted

## 2016-01-03 DIAGNOSIS — Z23 Encounter for immunization: Secondary | ICD-10-CM

## 2016-01-06 ENCOUNTER — Other Ambulatory Visit: Payer: Self-pay | Admitting: Internal Medicine

## 2016-03-09 ENCOUNTER — Encounter: Payer: Self-pay | Admitting: Family Medicine

## 2016-03-09 ENCOUNTER — Ambulatory Visit (INDEPENDENT_AMBULATORY_CARE_PROVIDER_SITE_OTHER): Payer: Medicare Other | Admitting: Family Medicine

## 2016-03-09 VITALS — BP 128/80 | HR 67 | Temp 98.0°F | Ht 61.5 in | Wt 141.8 lb

## 2016-03-09 DIAGNOSIS — J029 Acute pharyngitis, unspecified: Secondary | ICD-10-CM | POA: Diagnosis not present

## 2016-03-09 LAB — POCT RAPID STREP A (OFFICE): RAPID STREP A SCREEN: NEGATIVE

## 2016-03-09 NOTE — Addendum Note (Signed)
Addended by: Agnes Lawrence on: 03/09/2016 05:15 PM   Modules accepted: Orders

## 2016-03-09 NOTE — Addendum Note (Signed)
Addended by: Agnes Lawrence on: 03/09/2016 05:11 PM   Modules accepted: Orders

## 2016-03-09 NOTE — Progress Notes (Signed)
Pre visit review using our clinic review tool, if applicable. No additional management support is needed unless otherwise documented below in the visit note. 

## 2016-03-09 NOTE — Patient Instructions (Signed)
INSTRUCTIONS FOR UPPER RESPIRATORY INFECTION:  -nasal saline wash 2-3 times daily (use prepackaged nasal saline or bottled/distilled water if making your own)   -can use tylenol (in no history of liver disease) or ibuprofen (if no history of kidney disease, bowel bleeding or significant heart disease) as directed for aches and sorethroat  -in the winter time, using a humidifier at night is helpful (please follow cleaning instructions)  -if you are taking a cough medication - use only as directed, may also try a teaspoon of honey to coat the throat and throat lozenges.  -for sore throat, salt water gargles can help  -follow up if you have fevers, facial pain, tooth pain, difficulty breathing or are worsening or symptoms persist longer then expected  Upper Respiratory Infection, Adult An upper respiratory infection (URI) is also known as the common cold. It is often caused by a type of germ (virus). Colds are easily spread (contagious). You can pass it to others by kissing, coughing, sneezing, or drinking out of the same glass. Usually, you get better in 1 to 3  weeks.  However, the cough can last for even longer. HOME CARE   Only take medicine as told by your doctor. Follow instructions provided above.  Drink enough water and fluids to keep your pee (urine) clear or pale yellow.  Get plenty of rest.  Return to work when your temperature is < 100 for 24 hours or as told by your doctor. You may use a face mask and wash your hands to stop your cold from spreading. GET HELP RIGHT AWAY IF:   After the first few days, you feel you are getting worse.  You have questions about your medicine.  You have chills, shortness of breath, or red spit (mucus).  You have pain in the face for more then 1-2 days, especially when you bend forward.  You have a fever, puffy (swollen) neck, pain when you swallow, or white spots in the back of your throat.  You have a bad headache, ear pain, sinus pain, or  chest pain.  You have a high-pitched whistling sound when you breathe in and out (wheezing).  You cough up blood.  You have sore muscles or a stiff neck. MAKE SURE YOU:   Understand these instructions.  Will watch your condition.  Will get help right away if you are not doing well or get worse. Document Released: 09/23/2007 Document Revised: 06/29/2011 Document Reviewed: 07/12/2013 [facility] Patient Information 2015 [place], [place]. This information is not intended to replace advice given to you by your health care provider. Make sure you discuss any questions you have with your health care provider.

## 2016-03-09 NOTE — Progress Notes (Signed)
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  Past Medical History:  Diagnosis Date  . Breast cancer ([place]) 2005   right, s/p xrt  . Dysrhythmia    had halter monitor 4/13-all ok for palpatations  . Fluttering heart    and racing over the past several years with lightheadedness  . H/O urinary incontinence   . Hypertension   . Hypothyroidism     Past Surgical History:  Procedure Laterality Date  . BREAST LUMPECTOMY  2005   right  . EAR CYST EXCISION Left 06/15/2012   Procedure: Left scaphoid cyst excision with distal radius graft;  Surgeon: Wynonia Sours, MD;  Location: [facility];  Service: Orthopedics;  Laterality: Left;  EXCISION CYST LEFT SCAPHOID DISTAL RADIUS GRAFT  . EXCISION MORTON'S NEUROMA  2001   left foot  . GANGLION CYST EXCISION  2005   left thumb  . HAMMER TOE SURGERY  2012   right 5th toe  . HEMORROIDECTOMY  1973  . HYSTEROSCOPY  [id number]  . NEUROPLASTY / TRANSPOSITION MEDIAN NERVE AT CARPAL TUNNEL BILATERAL  2002  . PILONIDAL CYST / SINUS EXCISION  1968  . plantar fibroma     4th toe, right foot  . THYROID LOBECTOMY  2004    Family History  Problem Relation Age of Onset  . Colon cancer Neg Hx   . Stomach cancer Neg Hx     Social History   Social History  . Marital status: Married    Spouse name: N/A  . Number of children: N/A  . Years of education: N/A   Social History Main Topics  . Smoking status: Never Smoker  . Smokeless tobacco: Never Used  . Alcohol use Yes     Comment: rare  . Drug use: No  . Sexual activity: Not Asked   Other Topics Concern  . None   Social History Narrative   Pt lives in [place].   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           Current Outpatient Prescriptions:  .  Cholecalciferol (VITAMIN D3) 5000 UNITS TABS, Take 1 tablet by mouth 2 (two) times a week., Disp: , Rfl:  .  hydrochlorothiazide (MICROZIDE) 12.5 MG capsule, TAKE 1 TABLET (12.5 MG TOTAL) BY MOUTH DAILY., Disp: 90 capsule, Rfl: 1 .  IRON, FERROUS GLUCONATE, PO, Take 22 mg by mouth 2 (two) times a week., Disp: , Rfl:  .  OVER THE COUNTER MEDICATION, Vitamins A - 2500 IU / D-300 IU / E-100 IU / Riboflavin-5 mg / Niacin-30 mg / Iodine-200 mcg / Magnesium-15 mg / Zinc-5 mg / Selenium-30 mcg / Copper-0.1 mg / Manganese-2 mg Takes Daily, Disp: , Rfl:  .  Probiotic Product (PROBIOTIC DAILY PO), Take by mouth. Jarrow-Dophilus EPS, 5 billion per capsule, one capsule Twice Weekly, Disp: , Rfl:  .  SYNTHROID 75 MCG tablet, TAKE 1 TABLET (75 MCG TOTAL) BY MOUTH DAILY., Disp: 90 tablet, Rfl: 0 .  vitamin C (ASCORBIC ACID) 250 MG tablet, Take 250 mg by mouth 2 (two) times a week., Disp: , Rfl:   EXAM:  Vitals:   03/09/16 1651  BP: 128/80  Pulse: 67  Temp:  76 F (36.7 C)    Body mass index is 26.36 kg/m.  GENERAL: vitals reviewed and listed above, alert, oriented, appears well hydrated and in no acute distress  HEENT: atraumatic, conjunttiva clear, no obvious abnormalities on inspection of external nose and ears, normal appearance of ear canals and TMs, clear nasal congestion, mild post oropharyngeal erythema with PND, no tonsillar edema or exudate, no sinus TTP  NECK: no obvious LAD  LUNGS: clear to auscultation bilaterally, no wheezes, rales or rhonchi, good air movement  CV: HRRR, no peripheral edema  MS: moves all extremities without noticeable abnormality  PSYCH: pleasant and cooperative, no obvious depression or anxiety  ASSESSMENT AND PLAN:  Discussed the following assessment and plan:  Sore throat  -given HPI and exam findings today, a serious infection or illness is unlikely. We  discussed potential etiologies, with VURI being most likely, and advised supportive care and monitoring. Rapid strep neg. Culture pending given her concern. We discussed treatment side effects, likely course, antibiotic misuse, transmission, and signs of developing a serious illness. -of course, we advised to return or notify a doctor immediately if symptoms worsen or persist or new concerns arise.    Patient Instructions  INSTRUCTIONS FOR UPPER RESPIRATORY INFECTION:  -nasal saline wash 2-3 times daily (use prepackaged nasal saline or bottled/distilled water if making your own)   -can use tylenol (in no history of liver disease) or ibuprofen (if no history of kidney disease, bowel bleeding or significant heart disease) as directed for aches and sorethroat  -in the winter time, using a humidifier at night is helpful (please follow cleaning instructions)  -if you are taking a cough medication - use only as directed, may also try a teaspoon of honey to coat the throat and throat lozenges.  -for sore throat, salt water gargles can help  -follow up if you have fevers, facial pain, tooth pain, difficulty breathing or are worsening or symptoms persist longer then expected  Upper Respiratory Infection, Adult An upper respiratory infection (URI) is also known as the common cold. It is often caused by a type of germ (virus). Colds are easily spread (contagious). You can pass it to others by kissing, coughing, sneezing, or drinking out of the same glass. Usually, you get better in 1 to 3  weeks.  However, the cough can last for even longer. HOME CARE   Only take medicine as told by your doctor. Follow instructions provided above.  Drink enough water and fluids to keep your pee (urine) clear or pale yellow.  Get plenty of rest.  Return to work when your temperature is < 100 for 24 hours or as told by your doctor. You may use a face mask and wash your hands to stop your cold from spreading. GET  HELP RIGHT AWAY IF:   After the first few days, you feel you are getting worse.  You have questions about your medicine.  You have chills, shortness of breath, or red spit (mucus).  You have pain in the face for more then 1-2 days, especially when you bend forward.  You have a fever, puffy (swollen) neck, pain when you swallow, or white spots in the back of your throat.  You have a bad headache, ear pain, sinus pain, or chest pain.  You have a high-pitched whistling sound when you breathe in and out (wheezing).  You cough up blood.  You have sore muscles or a stiff neck. MAKE SURE YOU:   Understand these instructions.  Will  watch your condition.  Will get help right away if you are not doing well or get worse. Document Released: 09/23/2007 Document Revised: 06/29/2011 Document Reviewed: 07/12/2013 [facility] Patient Information 2015 [place], [place]. This information is not intended to replace advice given to you by your health care provider. Make sure you discuss any questions you have with your health care provider.    Colin Benton R., DO

## 2016-03-11 LAB — CULTURE, GROUP A STREP: ORGANISM ID, BACTERIA: NORMAL

## 2016-03-27 ENCOUNTER — Other Ambulatory Visit: Payer: Self-pay | Admitting: Internal Medicine

## 2016-04-22 ENCOUNTER — Other Ambulatory Visit: Payer: Self-pay | Admitting: Internal Medicine

## 2016-04-22 DIAGNOSIS — Z853 Personal history of malignant neoplasm of breast: Secondary | ICD-10-CM

## 2016-04-22 DIAGNOSIS — Z1231 Encounter for screening mammogram for malignant neoplasm of breast: Secondary | ICD-10-CM

## 2016-05-20 ENCOUNTER — Ambulatory Visit
Admission: RE | Admit: 2016-05-20 | Discharge: 2016-05-20 | Disposition: A | Discharge status: Home / Self Care | Payer: Medicare Other | Source: Ambulatory Visit | Attending: Internal Medicine | Admitting: Internal Medicine

## 2016-05-20 DIAGNOSIS — Z853 Personal history of malignant neoplasm of breast: Secondary | ICD-10-CM

## 2016-05-20 DIAGNOSIS — Z1231 Encounter for screening mammogram for malignant neoplasm of breast: Secondary | ICD-10-CM | POA: Diagnosis not present

## 2016-06-08 DIAGNOSIS — H52223 Regular astigmatism, bilateral: Secondary | ICD-10-CM | POA: Diagnosis not present

## 2016-06-08 DIAGNOSIS — H5213 Myopia, bilateral: Secondary | ICD-10-CM | POA: Diagnosis not present

## 2016-06-08 DIAGNOSIS — H524 Presbyopia: Secondary | ICD-10-CM | POA: Diagnosis not present

## 2016-06-08 DIAGNOSIS — H04123 Dry eye syndrome of bilateral lacrimal glands: Secondary | ICD-10-CM | POA: Diagnosis not present

## 2016-07-02 ENCOUNTER — Other Ambulatory Visit: Payer: Self-pay | Admitting: Internal Medicine

## 2016-07-20 DIAGNOSIS — E890 Postprocedural hypothyroidism: Secondary | ICD-10-CM | POA: Diagnosis not present

## 2016-07-27 DIAGNOSIS — E041 Nontoxic single thyroid nodule: Secondary | ICD-10-CM | POA: Diagnosis not present

## 2016-07-27 DIAGNOSIS — E890 Postprocedural hypothyroidism: Secondary | ICD-10-CM | POA: Diagnosis not present

## 2016-09-15 ENCOUNTER — Other Ambulatory Visit: Payer: Self-pay | Admitting: Internal Medicine

## 2016-09-22 ENCOUNTER — Other Ambulatory Visit: Payer: Self-pay | Admitting: Internal Medicine

## 2016-10-29 NOTE — Progress Notes (Deleted)
Pre visit review using our clinic review tool, if applicable. No additional management support is needed unless otherwise documented below in the visit note. 

## 2016-10-29 NOTE — Progress Notes (Deleted)
Subjective:   Tiffany Reed is a 76 y.o. female who presents for Medicare Annual (Subsequent) preventive examination.  Review of Systems:  No ROS.  Medicare Wellness Visit. Additional risk factors are reflected in the social history.    Sleep patterns:  Home Safety/Smoke Alarms: Feels safe in home. Smoke alarms in place.  Living environment; residence and Firearm Safety: [place] Safety/Bike Helmet: Wears seat belt.   Counseling:   Eye Exam-  Dental-  Female:   Pap-       Mammo-  05/20/2016    Negative Dexa scan-       01/02/2015  the BMD is low according to the [facility] classification for osteoporosis  CCS-  06/23/2011  10 year recall.     Objective:     Vitals: There were no vitals taken for this visit.  There is no height or weight on file to calculate BMI.   Tobacco History  Smoking Status  . Never Smoker  Smokeless Tobacco  . Never Used     Counseling given: Not Answered   Past Medical History:  Diagnosis Date  . Breast cancer ([place]) 2005   right, s/p xrt  . Dysrhythmia    had halter monitor 4/13-all ok for palpatations  . Fluttering heart    and racing over the past several years with lightheadedness  . H/O urinary incontinence   . Hypertension   . Hypothyroidism    Past Surgical History:  Procedure Laterality Date  . BREAST LUMPECTOMY  2005   right  . EAR CYST EXCISION Left 06/15/2012   Procedure: Left scaphoid cyst excision with distal radius graft;  Surgeon: Wynonia Sours, MD;  Location: [facility];  Service: Orthopedics;  Laterality: Left;  EXCISION CYST LEFT SCAPHOID DISTAL RADIUS GRAFT  . EXCISION MORTON'S NEUROMA  2001   left foot  . GANGLION CYST EXCISION  2005   left thumb  . HAMMER TOE SURGERY  2012   right 5th toe  . HEMORROIDECTOMY  1973  . HYSTEROSCOPY  [id number]  . NEUROPLASTY / TRANSPOSITION MEDIAN NERVE AT CARPAL TUNNEL BILATERAL  2002  . PILONIDAL CYST / SINUS EXCISION  1968  . plantar fibroma     4th toe, right foot   . THYROID LOBECTOMY  2004   Family History  Problem Relation Age of Onset  . Colon cancer Neg Hx   . Stomach cancer Neg Hx    History  Sexual Activity  . Sexual activity: Not on file    Outpatient Encounter Prescriptions as of 11/05/2016  Medication Sig  . Cholecalciferol (VITAMIN D3) 5000 UNITS TABS Take 1 tablet by mouth 2 (two) times a week.  . hydrochlorothiazide (MICROZIDE) 12.5 MG capsule TAKE ONE CAPSULE BY MOUTH EVERY DAY  . IRON, FERROUS GLUCONATE, PO Take 22 mg by mouth 2 (two) times a week.  Marland Kitchen OVER THE COUNTER MEDICATION Vitamins A - 2500 IU / D-300 IU / E-100 IU / Riboflavin-5 mg / Niacin-30 mg / Iodine-200 mcg / Magnesium-15 mg / Zinc-5 mg / Selenium-30 mcg / Copper-0.1 mg / Manganese-2 mg Takes Daily  . Probiotic Product (PROBIOTIC DAILY PO) Take by mouth. Jarrow-Dophilus EPS, 5 billion per capsule, one capsule Twice Weekly  . SYNTHROID 75 MCG tablet TAKE 1 TABLET (75 MCG TOTAL) BY MOUTH DAILY.  . vitamin C (ASCORBIC ACID) 250 MG tablet Take 250 mg by mouth 2 (two) times a week.   No facility-administered encounter medications on file as of 11/05/2016.  Activities of Daily Living No flowsheet data found.  Patient Care Team: Marletta Lor, MD as PCP - Bethena Roys, MD as Attending Physician (Cardiology)    Assessment:    Physical assessment deferred to PCP.  Exercise Activities and Dietary recommendations   Diet (meal preparation, eat out, water intake, caffeinated beverages, dairy products, fruits and vegetables):  Breakfast:  Lunch:  Dinner:      Goals    None     Fall Risk Fall Risk  12/18/2014 01/17/2013  Falls in the past year? Yes No  Number falls in past yr: 1 -  Injury with Fall? No -  Risk for fall due to : Impaired balance/gait -   Depression Screen PHQ 2/9 Scores 12/18/2014 01/17/2013  PHQ - 2 Score 0 0     Cognitive Function        Immunization History  Administered Date(s) Administered  . Hep A / Hep B  01/27/2011, 03/18/2011, 12/25/2011  . Influenza Split 01/27/2011, 12/25/2011  . Influenza Whole 01/25/2008, 04/07/2010  . Influenza, High Dose Seasonal PF 03/07/2014, 01/03/2016  . Influenza,inj,Quad PF,36+ Mos 01/17/2013  . Pneumococcal Conjugate-13 12/18/2014  . Pneumococcal Polysaccharide-23 12/25/2011  . Tetanus 01/17/2013  . Zoster 12/26/2010   Screening Tests Health Maintenance  Topic Date Due  . INFLUENZA VACCINE  11/18/2016  . MAMMOGRAM  05/20/2017  . COLONOSCOPY  06/22/2021  . TETANUS/TDAP  01/18/2023  . DEXA SCAN  Completed  . PNA vac Low Risk Adult  Completed      Plan:   ***   I have personally reviewed and noted the following in the patient's chart:   . Medical and social history . Use of alcohol, tobacco or illicit drugs  . Current medications and supplements . Functional ability and status . Nutritional status . Physical activity . Advanced directives . List of other physicians . Vitals . Screenings to include cognitive, depression, and falls . Referrals and appointments  In addition, I have reviewed and discussed with patient certain preventive protocols, quality metrics, and best practice recommendations. A written personalized care plan for preventive services as well as general preventive health recommendations were provided to patient.     Ree Edman, RN  10/29/2016

## 2016-11-03 ENCOUNTER — Ambulatory Visit (INDEPENDENT_AMBULATORY_CARE_PROVIDER_SITE_OTHER): Payer: Medicare Other | Admitting: Internal Medicine

## 2016-11-03 ENCOUNTER — Encounter: Payer: Self-pay | Admitting: Internal Medicine

## 2016-11-03 VITALS — BP 110/70 | HR 68 | Temp 98.2°F | Resp 12 | Ht 61.5 in | Wt 130.0 lb

## 2016-11-03 DIAGNOSIS — E2839 Other primary ovarian failure: Secondary | ICD-10-CM

## 2016-11-03 DIAGNOSIS — M8580 Other specified disorders of bone density and structure, unspecified site: Secondary | ICD-10-CM

## 2016-11-03 DIAGNOSIS — I10 Essential (primary) hypertension: Secondary | ICD-10-CM

## 2016-11-03 DIAGNOSIS — E039 Hypothyroidism, unspecified: Secondary | ICD-10-CM

## 2016-11-03 DIAGNOSIS — E785 Hyperlipidemia, unspecified: Secondary | ICD-10-CM | POA: Diagnosis not present

## 2016-11-03 LAB — CBC WITH DIFFERENTIAL/PLATELET
Basophils Absolute: 0 10*3/uL (ref 0.0–0.1)
Basophils Relative: 0.4 % (ref 0.0–3.0)
EOS PCT: 1 % (ref 0.0–5.0)
Eosinophils Absolute: 0 10*3/uL (ref 0.0–0.7)
HEMATOCRIT: 39 % (ref 36.0–46.0)
HEMOGLOBIN: 13.3 g/dL (ref 12.0–15.0)
LYMPHS PCT: 19.1 % (ref 12.0–46.0)
Lymphs Abs: 0.9 10*3/uL (ref 0.7–4.0)
MCHC: 34.2 g/dL (ref 30.0–36.0)
MCV: 88.5 fl (ref 78.0–100.0)
Monocytes Absolute: 0.3 10*3/uL (ref 0.1–1.0)
Monocytes Relative: 6.3 % (ref 3.0–12.0)
NEUTROS PCT: 73.2 % (ref 43.0–77.0)
Neutro Abs: 3.4 10*3/uL (ref 1.4–7.7)
Platelets: 258 10*3/uL (ref 150.0–400.0)
RBC: 4.41 Mil/uL (ref 3.87–5.11)
RDW: 13.8 % (ref 11.5–15.5)
WBC: 4.6 10*3/uL (ref 4.0–10.5)

## 2016-11-03 LAB — LIPID PANEL
Cholesterol: 216 mg/dL — ABNORMAL HIGH (ref 0–200)
HDL: 64.7 mg/dL (ref >39.00)
LDL Cholesterol: 142 mg/dL — ABNORMAL HIGH (ref 0–99)
NonHDL: 151.51
Total CHOL/HDL Ratio: 3
Triglycerides: 50 mg/dL (ref 0.0–149.0)
VLDL: 10 mg/dL (ref 0.0–40.0)

## 2016-11-03 LAB — COMPREHENSIVE METABOLIC PANEL
ALT: 11 U/L (ref 0–35)
AST: 16 U/L (ref 0–37)
Albumin: 4 g/dL (ref 3.5–5.2)
Alkaline Phosphatase: 67 U/L (ref 39–117)
BILIRUBIN TOTAL: 0.6 mg/dL (ref 0.2–1.2)
BUN: 15 mg/dL (ref 6–23)
CALCIUM: 9.9 mg/dL (ref 8.4–10.5)
CO2: 32 mEq/L (ref 19–32)
Chloride: 101 mEq/L (ref 96–112)
Creatinine, Ser: 0.71 mg/dL (ref 0.40–1.20)
GFR: 85.6 mL/min (ref >60.00)
GLUCOSE: 84 mg/dL (ref 70–99)
Potassium: 3.9 mEq/L (ref 3.5–5.1)
Sodium: 140 mEq/L (ref 135–145)
Total Protein: 6.7 g/dL (ref 6.0–8.3)

## 2016-11-03 MED ORDER — SYNTHROID 75 MCG PO TABS: ORAL_TABLET | ORAL | 4 refills | Status: DC

## 2016-11-03 MED ORDER — HYDROCHLOROTHIAZIDE 12.5 MG PO CAPS: 12.5000 mg | ORAL_CAPSULE | Freq: Every day | ORAL | 3 refills | Status: DC

## 2016-11-03 NOTE — Patient Instructions (Signed)
Limit your sodium (Salt) intake  Take a calcium supplement, plus 800-1200 units of vitamin D    It is important that you exercise regularly, at least 20 minutes 3 to 4 times per week.  If you develop chest pain or shortness of breath seek  medical attention.  Return in one year for follow-up   

## 2016-11-03 NOTE — Progress Notes (Signed)
Subjective:    Patient ID: Tiffany Reed, female    DOB: Jan 19, 1941, 76 y.o.   MRN: [id number]  HPI 76 year old patient who is seen today for her annual follow-up and Medicare wellness visit.  She has not been seen in over one year and has had no recent lab.  She was self referred to endocrinology in the spring and did have thyroid function studies done at that time She has a history of essential hypertension and osteopenia. She is followed by gynecology.  She has a history of right breast cancer and does obtain annual mammograms. No new concerns or complaints  Past Medical History:  Diagnosis Date  . Breast cancer ([place]) 2005   right, s/p xrt  . Dysrhythmia    had halter monitor 4/13-all ok for palpatations  . Fluttering heart    and racing over the past several years with lightheadedness  . H/O urinary incontinence   . Hypertension   . Hypothyroidism      Social History   Social History  . Marital status: Married    Spouse name: N/A  . Number of children: N/A  . Years of education: N/A   Occupational History  . Not on file.   Social History Main Topics  . Smoking status: Never Smoker  . Smokeless tobacco: Never Used  . Alcohol use Yes     Comment: rare  . Drug use: No  . Sexual activity: Not on file   Other Topics Concern  . Not on file   Social History Narrative   Pt lives in [place].   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          Past Surgical History:  Procedure Laterality Date  . BREAST LUMPECTOMY  2005   right  . EAR CYST EXCISION Left 06/15/2012   Procedure: Left scaphoid cyst excision with distal radius graft;  Surgeon: Wynonia Sours, MD;  Location: [facility];  Service: Orthopedics;  Laterality: Left;  EXCISION CYST LEFT SCAPHOID DISTAL RADIUS GRAFT  . EXCISION MORTON'S NEUROMA  2001   left foot  . GANGLION CYST EXCISION  2005   left thumb  . HAMMER TOE SURGERY  2012   right 5th toe  . HEMORROIDECTOMY   1973  . HYSTEROSCOPY  [id number]  . NEUROPLASTY / TRANSPOSITION MEDIAN NERVE AT CARPAL TUNNEL BILATERAL  2002  . PILONIDAL CYST / SINUS EXCISION  1968  . plantar fibroma     4th toe, right foot  . THYROID LOBECTOMY  2004    Family History  Problem Relation Age of Onset  . Colon cancer Neg Hx   . Stomach cancer Neg Hx     Allergies  Allergen Reactions  . Sorbitol     Gas, diarrhea  . Penicillins Rash    Current Outpatient Prescriptions on File Prior to Visit  Medication Sig Dispense Refill  . hydrochlorothiazide (MICROZIDE) 12.5 MG capsule TAKE ONE CAPSULE BY MOUTH EVERY DAY 90 capsule 1  . SYNTHROID 75 MCG tablet TAKE 1 TABLET (75 MCG TOTAL) BY MOUTH DAILY. 30 tablet 0  . vitamin C (ASCORBIC ACID) 250 MG tablet Take 250 mg by mouth 2 (two) times a week.    . Cholecalciferol (VITAMIN D3) 5000 UNITS TABS Take 1 tablet by mouth 2 (two) times a week.    . IRON, FERROUS GLUCONATE, PO Take 22 mg by mouth 2 (two) times a week.    Marland Kitchen OVER THE COUNTER  MEDICATION Vitamins A - 2500 IU / D-300 IU / E-100 IU / Riboflavin-5 mg / Niacin-30 mg / Iodine-200 mcg / Magnesium-15 mg / Zinc-5 mg / Selenium-30 mcg / Copper-0.1 mg / Manganese-2 mg Takes Daily    . Probiotic Product (PROBIOTIC DAILY PO) Take by mouth. Jarrow-Dophilus EPS, 5 billion per capsule, one capsule Twice Weekly     No current facility-administered medications on file prior to visit.     BP 110/70 (BP Location: Left Arm, Patient Position: Sitting, Cuff Size: Normal)   Pulse 68   Temp 98.2 F (36.8 C) (Oral)   Resp 12   Ht 5' 1.5" (1.562 m)   Wt 130 lb (59 kg)   SpO2 98%   BMI 24.17 kg/m   Medicare wellness visit  1. Risk factors, based on past  M,S,F history.  Cardiovascular risk factors include hypertension only  2.  Physical activities:remains quite active.  Enjoys travel  3.  Depression/mood:no history of major depression or mood disorder  4.  Hearing:no deficits  5.  ADL's:independent in all aspects of daily  living  6.  Fall risk:low  7.  Home safety:no problems identified  8.  Height weight, and visual acuity;height and weight stable no change in visual acuity  9.  Counseling:continue active lifestyle, regular.  Exercise and vitamin D supplementation.  Modest weight loss encouraged  10. Lab orders based on risk factors:laboratory update reviewed  11. Referral :follow-up OB/GYN in endocrinology  12. Care plan:continue efforts at aggressive risk factor modification  13. Cognitive assessment: alert and oriented with normal affect no cognitive deficits  14. Screening: Patient provided with a written and personalized 5-10 year screening schedule in the AVS.    15. Provider List Update: primary care endocrinology and OB/GYN as well as radiology    Review of Systems  Constitutional: Negative.   HENT: Negative for congestion, dental problem, hearing loss, rhinorrhea, sinus pressure, sore throat and tinnitus.   Eyes: Negative for pain, discharge and visual disturbance.  Respiratory: Negative for cough and shortness of breath.   Cardiovascular: Negative for chest pain, palpitations and leg swelling.  Gastrointestinal: Negative for abdominal distention, abdominal pain, blood in stool, constipation, diarrhea, nausea and vomiting.  Genitourinary: Negative for difficulty urinating, dysuria, flank pain, frequency, hematuria, pelvic pain, urgency, vaginal bleeding, vaginal discharge and vaginal pain.  Musculoskeletal: Negative for arthralgias, gait problem and joint swelling.  Skin: Negative for rash.  Neurological: Negative for dizziness, syncope, speech difficulty, weakness, numbness and headaches.  Hematological: Negative for adenopathy.  Psychiatric/Behavioral: Negative for agitation, behavioral problems and dysphoric mood. The patient is not nervous/anxious.        Objective:   Physical Exam  Constitutional: She is oriented to person, place, and time. She appears well-developed and  well-nourished.  HENT:  Head: Normocephalic.  Right Ear: External ear normal.  Left Ear: External ear normal.  Mouth/Throat: Oropharynx is clear and moist.  Eyes: Pupils are equal, round, and reactive to light. Conjunctivae and EOM are normal.  Neck: Normal range of motion. Neck supple. No thyromegaly present.  Partial thyroidectomy  Cardiovascular: Normal rate, regular rhythm, normal heart sounds and intact distal pulses.   Pulmonary/Chest: Effort normal and breath sounds normal.  Abdominal: Soft. Bowel sounds are normal. She exhibits no mass. There is no tenderness.  Musculoskeletal: Normal range of motion.  Lymphadenopathy:    She has no cervical adenopathy.  Neurological: She is alert and oriented to person, place, and time.  Skin: Skin is warm and dry. No  rash noted.  Psychiatric: She has a normal mood and affect. Her behavior is normal.          Assessment & Plan:   Essential hypertension, well-controlled Hypothyroidism Subsequent Medicare wellness visit   All medications updated We'll review screening lab Continue vitamin D supplementation an annual mammograms  CPX one year  Nyoka Cowden

## 2016-11-05 ENCOUNTER — Other Ambulatory Visit: Payer: Self-pay | Admitting: Internal Medicine

## 2016-11-05 ENCOUNTER — Ambulatory Visit: Payer: Medicare Other

## 2016-12-24 ENCOUNTER — Telehealth: Payer: Self-pay | Admitting: Internal Medicine

## 2016-12-24 DIAGNOSIS — Z124 Encounter for screening for malignant neoplasm of cervix: Secondary | ICD-10-CM | POA: Diagnosis not present

## 2016-12-24 DIAGNOSIS — Z6823 Body mass index (BMI) 23.0-23.9, adult: Secondary | ICD-10-CM | POA: Diagnosis not present

## 2016-12-24 NOTE — Telephone Encounter (Signed)
The patient is wanting to know if she needs immunizations before she goes on a trip September 24th   [place]  [phone] cell  [phone] home

## 2016-12-25 NOTE — Telephone Encounter (Signed)
Please advise 

## 2016-12-28 NOTE — Telephone Encounter (Signed)
Left a very detailed voice message that there is no special immunizations needed unless recommended by their travel agent

## 2016-12-28 NOTE — Telephone Encounter (Signed)
No special immunizations needed unless recommended by their travel agent

## 2017-01-07 ENCOUNTER — Encounter: Payer: Self-pay | Admitting: Internal Medicine

## 2017-03-17 DIAGNOSIS — R600 Localized edema: Secondary | ICD-10-CM | POA: Diagnosis not present

## 2017-03-17 DIAGNOSIS — M79605 Pain in left leg: Secondary | ICD-10-CM | POA: Diagnosis not present

## 2017-03-17 DIAGNOSIS — M79604 Pain in right leg: Secondary | ICD-10-CM | POA: Diagnosis not present

## 2017-04-06 DIAGNOSIS — I8312 Varicose veins of left lower extremity with inflammation: Secondary | ICD-10-CM | POA: Diagnosis not present

## 2017-04-06 DIAGNOSIS — I8311 Varicose veins of right lower extremity with inflammation: Secondary | ICD-10-CM | POA: Diagnosis not present

## 2017-05-05 DIAGNOSIS — I87323 Chronic venous hypertension (idiopathic) with inflammation of bilateral lower extremity: Secondary | ICD-10-CM | POA: Diagnosis not present

## 2017-05-12 ENCOUNTER — Other Ambulatory Visit: Payer: Self-pay | Admitting: Internal Medicine

## 2017-05-12 DIAGNOSIS — Z139 Encounter for screening, unspecified: Secondary | ICD-10-CM

## 2017-05-31 ENCOUNTER — Ambulatory Visit
Admission: RE | Admit: 2017-05-31 | Discharge: 2017-05-31 | Disposition: A | Discharge status: Home / Self Care | Payer: Medicare Other | Source: Ambulatory Visit | Attending: Internal Medicine | Admitting: Internal Medicine

## 2017-05-31 DIAGNOSIS — Z1231 Encounter for screening mammogram for malignant neoplasm of breast: Secondary | ICD-10-CM | POA: Diagnosis not present

## 2017-05-31 DIAGNOSIS — Z139 Encounter for screening, unspecified: Secondary | ICD-10-CM

## 2017-05-31 HISTORY — DX: Personal history of irradiation: Z92.3

## 2017-06-01 DIAGNOSIS — L820 Inflamed seborrheic keratosis: Secondary | ICD-10-CM | POA: Diagnosis not present

## 2017-06-01 DIAGNOSIS — D2271 Melanocytic nevi of right lower limb, including hip: Secondary | ICD-10-CM | POA: Diagnosis not present

## 2017-06-01 DIAGNOSIS — D2261 Melanocytic nevi of right upper limb, including shoulder: Secondary | ICD-10-CM | POA: Diagnosis not present

## 2017-06-01 DIAGNOSIS — D2262 Melanocytic nevi of left upper limb, including shoulder: Secondary | ICD-10-CM | POA: Diagnosis not present

## 2017-06-01 DIAGNOSIS — L821 Other seborrheic keratosis: Secondary | ICD-10-CM | POA: Diagnosis not present

## 2017-06-01 DIAGNOSIS — D225 Melanocytic nevi of trunk: Secondary | ICD-10-CM | POA: Diagnosis not present

## 2017-06-01 DIAGNOSIS — D2272 Melanocytic nevi of left lower limb, including hip: Secondary | ICD-10-CM | POA: Diagnosis not present

## 2017-06-01 DIAGNOSIS — D485 Neoplasm of uncertain behavior of skin: Secondary | ICD-10-CM | POA: Diagnosis not present

## 2017-06-03 ENCOUNTER — Telehealth: Payer: Self-pay | Admitting: Internal Medicine

## 2017-06-03 NOTE — Telephone Encounter (Addendum)
Copied from [id number]. Topic: Quick Communication - See Telephone Encounter >> Jun 03, 2017  5:01 PM Ivar Drape wrote: CRM for notification. See Telephone encounter for:  06/03/17. Patient would like a call back concerning her preventative injections.  She would like to make an appt for the ones she need.  Please call between 11-2pm.

## 2017-06-09 DIAGNOSIS — H3530 Unspecified macular degeneration: Secondary | ICD-10-CM | POA: Diagnosis not present

## 2017-06-09 DIAGNOSIS — H52223 Regular astigmatism, bilateral: Secondary | ICD-10-CM | POA: Diagnosis not present

## 2017-06-09 DIAGNOSIS — H2513 Age-related nuclear cataract, bilateral: Secondary | ICD-10-CM | POA: Diagnosis not present

## 2017-06-09 DIAGNOSIS — H5213 Myopia, bilateral: Secondary | ICD-10-CM | POA: Diagnosis not present

## 2017-06-09 DIAGNOSIS — H524 Presbyopia: Secondary | ICD-10-CM | POA: Diagnosis not present

## 2017-06-09 NOTE — Telephone Encounter (Signed)
Patient not available at time of call. She would like a call back tomorrow.   She is due for flu vaccine (if she has not received elsewhere) and could get Shingrix vaccine at the pharmacy. Her insurance will not cover Shingrix vaccine in the office.

## 2017-06-11 NOTE — Telephone Encounter (Signed)
Left message for patient to return phone call.  

## 2017-06-16 NOTE — Telephone Encounter (Signed)
Spoke with patient and scheduled an appointment for yearly follow-up.

## 2017-07-06 ENCOUNTER — Other Ambulatory Visit: Payer: Self-pay | Admitting: Endocrinology

## 2017-07-06 DIAGNOSIS — E041 Nontoxic single thyroid nodule: Secondary | ICD-10-CM

## 2017-07-19 ENCOUNTER — Ambulatory Visit
Admission: RE | Admit: 2017-07-19 | Discharge: 2017-07-19 | Disposition: A | Discharge status: Home / Self Care | Payer: Medicare Other | Source: Ambulatory Visit | Attending: Endocrinology | Admitting: Endocrinology

## 2017-07-19 DIAGNOSIS — E041 Nontoxic single thyroid nodule: Secondary | ICD-10-CM | POA: Diagnosis not present

## 2017-07-28 DIAGNOSIS — E041 Nontoxic single thyroid nodule: Secondary | ICD-10-CM | POA: Diagnosis not present

## 2017-07-28 DIAGNOSIS — E890 Postprocedural hypothyroidism: Secondary | ICD-10-CM | POA: Diagnosis not present

## 2017-08-04 ENCOUNTER — Encounter: Payer: Self-pay | Admitting: Internal Medicine

## 2017-08-04 ENCOUNTER — Ambulatory Visit (INDEPENDENT_AMBULATORY_CARE_PROVIDER_SITE_OTHER): Payer: Medicare Other | Admitting: Internal Medicine

## 2017-08-04 VITALS — BP 110/60 | HR 76 | Temp 98.5°F | Wt 132.0 lb

## 2017-08-04 DIAGNOSIS — E039 Hypothyroidism, unspecified: Secondary | ICD-10-CM

## 2017-08-04 DIAGNOSIS — I10 Essential (primary) hypertension: Secondary | ICD-10-CM | POA: Diagnosis not present

## 2017-08-04 NOTE — Progress Notes (Signed)
Subjective:    Patient ID: Tiffany Reed, female    DOB: 1941/02/09, 77 y.o.   MRN: [id number]  HPI  77 year old patient has a history of essential hypertension and hypothyroidism.  She is followed by endocrinology.  She was scheduled today for an annual exam that is several months early and will be deferred.  In general she is doing well. Remote history of right breast cancer.  She and her husband are quite active with international travel  Past Medical History:  Diagnosis Date  . Breast cancer ([place]) 2005   right, s/p xrt  . Dysrhythmia    had halter monitor 4/13-all ok for palpatations  . Fluttering heart    and racing over the past several years with lightheadedness  . H/O urinary incontinence   . Hypertension   . Hypothyroidism   . Personal history of radiation therapy      Social History   Socioeconomic History  . Marital status: Married    Spouse name: Not on file  . Number of children: Not on file  . Years of education: Not on file  . Highest education level: Not on file  Occupational History  . Not on file  Social Needs  . Financial resource strain: Not on file  . Food insecurity:    Worry: Not on file    Inability: Not on file  . Transportation needs:    Medical: Not on file    Non-medical: Not on file  Tobacco Use  . Smoking status: Never Smoker  . Smokeless tobacco: Never Used  Substance and Sexual Activity  . Alcohol use: Yes    Comment: rare  . Drug use: No  . Sexual activity: Not on file  Lifestyle  . Physical activity:    Days per week: Not on file    Minutes per session: Not on file  . Stress: Not on file  Relationships  . Social connections:    Talks on phone: Not on file    Gets together: Not on file    Attends religious service: Not on file    Active member of club or organization: Not on file    Attends meetings of clubs or organizations: Not on file    Relationship status: Not on file  . Intimate partner violence:    Fear of  current or ex partner: Not on file    Emotionally abused: Not on file    Physically abused: Not on file    Forced sexual activity: Not on file  Other Topics Concern  . Not on file  Social History Narrative   Pt lives in [place].   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          Past Surgical History:  Procedure Laterality Date  . BREAST LUMPECTOMY  2005   right  . EAR CYST EXCISION Left 06/15/2012   Procedure: Left scaphoid cyst excision with distal radius graft;  Surgeon: Wynonia Sours, MD;  Location: [facility];  Service: Orthopedics;  Laterality: Left;  EXCISION CYST LEFT SCAPHOID DISTAL RADIUS GRAFT  . EXCISION MORTON'S NEUROMA  2001   left foot  . GANGLION CYST EXCISION  2005   left thumb  . HAMMER TOE SURGERY  2012   right 5th toe  . HEMORROIDECTOMY  1973  . HYSTEROSCOPY  [id number]  . NEUROPLASTY / TRANSPOSITION MEDIAN NERVE AT CARPAL TUNNEL BILATERAL  2002  . PILONIDAL CYST / SINUS EXCISION  1968  . plantar fibroma     4th toe, right foot  . THYROID LOBECTOMY  2004    Family History  Problem Relation Age of Onset  . Colon cancer Neg Hx   . Stomach cancer Neg Hx     Allergies  Allergen Reactions  . Sorbitol     Gas, diarrhea  . Penicillins Rash    Current Outpatient Medications on File Prior to Visit  Medication Sig Dispense Refill  . Cholecalciferol (VITAMIN D3) 5000 UNITS TABS Take 1 tablet by mouth 2 (two) times a week.    . hydrochlorothiazide (MICROZIDE) 12.5 MG capsule Take 1 capsule (12.5 mg total) by mouth daily. 90 capsule 3  . IRON, FERROUS GLUCONATE, PO Take 22 mg by mouth 2 (two) times a week.    Marland Kitchen OVER THE COUNTER MEDICATION Vitamins A - 2500 IU / D-300 IU / E-100 IU / Riboflavin-5 mg / Niacin-30 mg / Iodine-200 mcg / Magnesium-15 mg / Zinc-5 mg / Selenium-30 mcg / Copper-0.1 mg / Manganese-2 mg Takes Daily    . Probiotic Product (PROBIOTIC DAILY PO) Take by mouth. Jarrow-Dophilus EPS, 5 billion per capsule,  one capsule Twice Weekly    . SYNTHROID 75 MCG tablet TAKE 1 TABLET (75 MCG TOTAL) BY MOUTH DAILY. 90 tablet 4  . vitamin C (ASCORBIC ACID) 250 MG tablet Take 250 mg by mouth 2 (two) times a week.     No current facility-administered medications on file prior to visit.     BP 110/60 (BP Location: Right Arm, Patient Position: Sitting, Cuff Size: Normal)   Pulse 76   Temp 98.5 F (36.9 C) (Oral)   Wt 132 lb (59.9 kg)   SpO2 98%   BMI 24.54 kg/m     Review of Systems  Constitutional: Negative.   HENT: Negative for congestion, dental problem, hearing loss, rhinorrhea, sinus pressure, sore throat and tinnitus.   Eyes: Negative for pain, discharge and visual disturbance.  Respiratory: Negative for cough and shortness of breath.   Cardiovascular: Negative for chest pain, palpitations and leg swelling.  Gastrointestinal: Negative for abdominal distention, abdominal pain, blood in stool, constipation, diarrhea, nausea and vomiting.  Genitourinary: Negative for difficulty urinating, dysuria, flank pain, frequency, hematuria, pelvic pain, urgency, vaginal bleeding, vaginal discharge and vaginal pain.  Musculoskeletal: Negative for arthralgias, gait problem and joint swelling.  Skin: Negative for rash.  Neurological: Negative for dizziness, syncope, speech difficulty, weakness, numbness and headaches.  Hematological: Negative for adenopathy.  Psychiatric/Behavioral: Negative for agitation, behavioral problems and dysphoric mood. The patient is not nervous/anxious.        Objective:   Physical Exam  Constitutional: She is oriented to person, place, and time. She appears well-developed and well-nourished.     Weight 132 blood pressure well controlled  HENT:  Head: Normocephalic.  Right Ear: External ear normal.  Left Ear: External ear normal.  Mouth/Throat: Oropharynx is clear and moist.  Eyes: Pupils are equal, round, and reactive to light. Conjunctivae and EOM are normal.  Neck:  Normal range of motion. Neck supple. No thyromegaly present.  Cardiovascular: Normal rate, regular rhythm, normal heart sounds and intact distal pulses.  Pulmonary/Chest: Effort normal and breath sounds normal.  Abdominal: Soft. Bowel sounds are normal. She exhibits no mass. There is no tenderness.  Musculoskeletal: Normal range of motion.  Lymphadenopathy:    She has no cervical adenopathy.  Neurological: She is alert and oriented to person, place, and time.  Skin: Skin is warm and dry. No  rash noted.  Psychiatric: She has a normal mood and affect. Her behavior is normal.          Assessment & Plan:   Essential hypertension well-controlled Hypothyroidism Remote history of right breast cancer  CPX 4 months Medications updated  Nyoka Cowden

## 2017-08-04 NOTE — Patient Instructions (Signed)
Limit your sodium (Salt) intake  Please check your blood pressure on a regular basis.  If it is consistently greater than 150/90, please make an office appointment.  Return for your annual exam in 4 months

## 2017-12-01 ENCOUNTER — Other Ambulatory Visit: Payer: Self-pay | Admitting: Internal Medicine

## 2017-12-06 ENCOUNTER — Encounter: Payer: Self-pay | Admitting: Internal Medicine

## 2017-12-06 ENCOUNTER — Ambulatory Visit (INDEPENDENT_AMBULATORY_CARE_PROVIDER_SITE_OTHER): Payer: Medicare Other | Admitting: Internal Medicine

## 2017-12-06 VITALS — BP 108/76 | HR 61 | Temp 98.6°F | Resp 16 | Wt 129.0 lb

## 2017-12-06 DIAGNOSIS — I10 Essential (primary) hypertension: Secondary | ICD-10-CM

## 2017-12-06 DIAGNOSIS — C50911 Malignant neoplasm of unspecified site of right female breast: Secondary | ICD-10-CM

## 2017-12-06 DIAGNOSIS — Z171 Estrogen receptor negative status [ER-]: Secondary | ICD-10-CM

## 2017-12-06 DIAGNOSIS — E039 Hypothyroidism, unspecified: Secondary | ICD-10-CM | POA: Diagnosis not present

## 2017-12-06 DIAGNOSIS — E785 Hyperlipidemia, unspecified: Secondary | ICD-10-CM | POA: Diagnosis not present

## 2017-12-06 DIAGNOSIS — C50921 Malignant neoplasm of unspecified site of right male breast: Secondary | ICD-10-CM

## 2017-12-06 MED ORDER — SYNTHROID 75 MCG PO TABS: ORAL_TABLET | ORAL | 4 refills | Status: DC

## 2017-12-06 MED ORDER — HYDROCHLOROTHIAZIDE 12.5 MG PO CAPS: ORAL_CAPSULE | ORAL | 5 refills | Status: DC

## 2017-12-06 NOTE — Progress Notes (Signed)
Subjective:    Patient ID: Tiffany Reed, female    DOB: 03/25/1941, 77 y.o.   MRN: [id number]  HPI  77 year old patient who is seen today for follow-up and annual health assessment She has a history of essential hypertension which has been well controlled on diuretic therapy.  She has a history of hypothyroidism and remains on supplemental levothyroxine. She is doing quite well without concerns or complaints  Mammogram in colonoscopy up-to-date  Past Medical History:  Diagnosis Date  . Breast cancer ([place]) 2005   right, s/p xrt  . Dysrhythmia    had halter monitor 4/13-all ok for palpatations  . Fluttering heart    and racing over the past several years with lightheadedness  . H/O urinary incontinence   . Hypertension   . Hypothyroidism   . Personal history of radiation therapy      Social History   Socioeconomic History  . Marital status: Married    Spouse name: Not on file  . Number of children: Not on file  . Years of education: Not on file  . Highest education level: Not on file  Occupational History  . Not on file  Social Needs  . Financial resource strain: Not on file  . Food insecurity:    Worry: Not on file    Inability: Not on file  . Transportation needs:    Medical: Not on file    Non-medical: Not on file  Tobacco Use  . Smoking status: Never Smoker  . Smokeless tobacco: Never Used  Substance and Sexual Activity  . Alcohol use: Yes    Comment: rare  . Drug use: No  . Sexual activity: Not on file  Lifestyle  . Physical activity:    Days per week: Not on file    Minutes per session: Not on file  . Stress: Not on file  Relationships  . Social connections:    Talks on phone: Not on file    Gets together: Not on file    Attends religious service: Not on file    Active member of club or organization: Not on file    Attends meetings of clubs or organizations: Not on file    Relationship status: Not on file  . Intimate partner violence:    Fear  of current or ex partner: Not on file    Emotionally abused: Not on file    Physically abused: Not on file    Forced sexual activity: Not on file  Other Topics Concern  . Not on file  Social History Narrative   Pt lives in [place].   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          Past Surgical History:  Procedure Laterality Date  . BREAST LUMPECTOMY  2005   right  . EAR CYST EXCISION Left 06/15/2012   Procedure: Left scaphoid cyst excision with distal radius graft;  Surgeon: Wynonia Sours, MD;  Location: [facility];  Service: Orthopedics;  Laterality: Left;  EXCISION CYST LEFT SCAPHOID DISTAL RADIUS GRAFT  . EXCISION MORTON'S NEUROMA  2001   left foot  . GANGLION CYST EXCISION  2005   left thumb  . HAMMER TOE SURGERY  2012   right 5th toe  . HEMORROIDECTOMY  1973  . HYSTEROSCOPY  [id number]  . NEUROPLASTY / TRANSPOSITION MEDIAN NERVE AT CARPAL TUNNEL BILATERAL  2002  . PILONIDAL CYST / SINUS EXCISION  1968  . plantar fibroma  4th toe, right foot  . THYROID LOBECTOMY  2004    Family History  Problem Relation Age of Onset  . Colon cancer Neg Hx   . Stomach cancer Neg Hx     Allergies  Allergen Reactions  . Sorbitol     Gas, diarrhea  . Penicillins Rash    Current Outpatient Medications on File Prior to Visit  Medication Sig Dispense Refill  . Cholecalciferol (VITAMIN D) 2000 units CAPS Take by mouth.    . IRON, FERROUS GLUCONATE, PO Take 22 mg by mouth 2 (two) times a week.    Marland Kitchen OVER THE COUNTER MEDICATION Vitamins A - 2500 IU / D-300 IU / E-100 IU / Riboflavin-5 mg / Niacin-30 mg / Iodine-200 mcg / Magnesium-15 mg / Zinc-5 mg / Selenium-30 mcg / Copper-0.1 mg / Manganese-2 mg Takes Daily    . Probiotic Product (PROBIOTIC DAILY PO) Take by mouth. Jarrow-Dophilus EPS, 5 billion per capsule, one capsule Twice Weekly    . vitamin C (ASCORBIC ACID) 250 MG tablet Take 250 mg by mouth 2 (two) times a week.     No current  facility-administered medications on file prior to visit.     BP 108/76   Pulse 61   Temp 98.6 F (37 C) (Oral)   Resp 16   Wt 129 lb (58.5 kg)   SpO2 98%   BMI 23.98 kg/m   Subsequent Medicare wellness visit  1. Risk factors, based on past  M,S,F history.  Cardiovascular risk factors include hypertension only  2.  Physical activities:remains quite active.  Enjoys travel  3.  Depression/mood:no history of major depression or mood disorder  4.  Hearing:no deficits  5.  ADL's:independent in all aspects of daily living  6.  Fall risk:low  7.  Home safety:no problems identified  8.  Height weight, and visual acuity;height and weight stable no change in visual acuity  9.  Counseling:continue active lifestyle, regular.  Exercise and vitamin D supplementation.  Modest weight loss encouraged  10. Lab orders based on risk factors:laboratory update reviewed  11. Referral :follow-up OB/GYN in endocrinology  12. Care plan:continue efforts at aggressive risk factor modification  13. Cognitive assessment: alert and oriented with normal affect no cognitive deficits  14. Screening: Patient provided with a written and personalized 5-10 year screening schedule in the AVS.    15. Provider List Update: primary care endocrinology and OB/GYN as well as radiology   Review of Systems  Constitutional: Negative.   HENT: Negative for congestion, dental problem, hearing loss, rhinorrhea, sinus pressure, sore throat and tinnitus.   Eyes: Negative for pain, discharge and visual disturbance.  Respiratory: Negative for cough and shortness of breath.   Cardiovascular: Negative for chest pain, palpitations and leg swelling.  Gastrointestinal: Negative for abdominal distention, abdominal pain, blood in stool, constipation, diarrhea, nausea and vomiting.  Genitourinary: Negative for difficulty urinating, dysuria, flank pain, frequency, hematuria, pelvic pain, urgency, vaginal bleeding,  vaginal discharge and vaginal pain.  Musculoskeletal: Negative for arthralgias, gait problem and joint swelling.  Skin: Negative for rash.  Neurological: Negative for dizziness, syncope, speech difficulty, weakness, numbness and headaches.  Hematological: Negative for adenopathy.  Psychiatric/Behavioral: Negative for agitation, behavioral problems and dysphoric mood. The patient is not nervous/anxious.        Objective:   Physical Exam  Constitutional: She is oriented to person, place, and time. She appears well-developed and well-nourished.  HENT:  Head: Normocephalic.  Right Ear: External ear normal.  Left Ear: External  ear normal.  Mouth/Throat: Oropharynx is clear and moist.  Eyes: Pupils are equal, round, and reactive to light. Conjunctivae and EOM are normal.  Neck: Normal range of motion. Neck supple. Thyromegaly present.  Cardiovascular: Normal rate, regular rhythm, normal heart sounds and intact distal pulses.  Pulmonary/Chest: Effort normal and breath sounds normal.  Abdominal: Soft. Bowel sounds are normal. She exhibits no mass. There is no tenderness.  Musculoskeletal: Normal range of motion.  Lymphadenopathy:    She has no cervical adenopathy.  Neurological: She is alert and oriented to person, place, and time.  Skin: Skin is warm and dry. No rash noted.  Psychiatric: She has a normal mood and affect. Her behavior is normal.          Assessment & Plan:   Essential hypertension well-controlled Hypothyroidism History of osteopenia History of breast cancer  Medications updated Patient will follow-up with new PCP in 6 months Will check updated lab including TSH  Marletta Lor

## 2017-12-06 NOTE — Patient Instructions (Addendum)
Limit your sodium (Salt) intake  Please check your blood pressure on a regular basis.  If it is consistently greater than 150/90, please make an office appointment.    It is important that you exercise regularly, at least 20 minutes 3 to 4 times per week.  If you develop chest pain or shortness of breath seek  medical attention.  Take a calcium supplement, plus [phone] units of vitamin D Health Maintenance for Postmenopausal Women Menopause is a normal process in which your reproductive ability comes to an end. This process happens gradually over a span of months to years, usually between the ages of 45 and 62. Menopause is complete when you have missed 12 consecutive menstrual periods. It is important to talk with your health care provider about some of the most common conditions that affect postmenopausal women, such as heart disease, cancer, and bone loss (osteoporosis). Adopting a healthy lifestyle and getting preventive care can help to promote your health and wellness. Those actions can also lower your chances of developing some of these common conditions. What should I know about menopause? During menopause, you may experience a number of symptoms, such as:  Moderate-to-severe hot flashes.  Night sweats.  Decrease in sex drive.  Mood swings.  Headaches.  Tiredness.  Irritability.  Memory problems.  Insomnia.  Choosing to treat or not to treat menopausal changes is an individual decision that you make with your health care provider. What should I know about hormone replacement therapy and supplements? Hormone therapy products are effective for treating symptoms that are associated with menopause, such as hot flashes and night sweats. Hormone replacement carries certain risks, especially as you become older. If you are thinking about using estrogen or estrogen with progestin treatments, discuss the benefits and risks with your health care provider. What should I know about  heart disease and stroke? Heart disease, heart attack, and stroke become more likely as you age. This may be due, in part, to the hormonal changes that your body experiences during menopause. These can affect how your body processes dietary fats, triglycerides, and cholesterol. Heart attack and stroke are both medical emergencies. There are many things that you can do to help prevent heart disease and stroke:  Have your blood pressure checked at least every 1-2 years. High blood pressure causes heart disease and increases the risk of stroke.  If you are 26-72 years old, ask your health care provider if you should take aspirin to prevent a heart attack or a stroke.  Do not use any tobacco products, including cigarettes, chewing tobacco, or electronic cigarettes. If you need help quitting, ask your health care provider.  It is important to eat a healthy diet and maintain a healthy weight. ? Be sure to include plenty of vegetables, fruits, low-fat dairy products, and lean protein. ? Avoid eating foods that are high in solid fats, added sugars, or salt (sodium).  Get regular exercise. This is one of the most important things that you can do for your health. ? Try to exercise for at least 150 minutes each week. The type of exercise that you do should increase your heart rate and make you sweat. This is known as moderate-intensity exercise. ? Try to do strengthening exercises at least twice each week. Do these in addition to the moderate-intensity exercise.  Know your numbers.Ask your health care provider to check your cholesterol and your blood glucose. Continue to have your blood tested as directed by your health care provider.  What  should I know about cancer screening? There are several types of cancer. Take the following steps to reduce your risk and to catch any cancer development as early as possible. Breast Cancer  Practice breast self-awareness. ? This means understanding how your  breasts normally appear and feel. ? It also means doing regular breast self-exams. Let your health care provider know about any changes, no matter how small.  If you are 68 or older, have a clinician do a breast exam (clinical breast exam or CBE) every year. Depending on your age, family history, and medical history, it may be recommended that you also have a yearly breast X-ray (mammogram).  If you have a family history of breast cancer, talk with your health care provider about genetic screening.  If you are at high risk for breast cancer, talk with your health care provider about having an MRI and a mammogram every year.  Breast cancer (BRCA) gene test is recommended for women who have family members with BRCA-related cancers. Results of the assessment will determine the need for genetic counseling and BRCA1 and for BRCA2 testing. BRCA-related cancers include these types: ? Breast. This occurs in males or females. ? Ovarian. ? Tubal. This may also be called fallopian tube cancer. ? Cancer of the abdominal or pelvic lining (peritoneal cancer). ? Prostate. ? Pancreatic.  Cervical, Uterine, and Ovarian Cancer Your health care provider may recommend that you be screened regularly for cancer of the pelvic organs. These include your ovaries, uterus, and vagina. This screening involves a pelvic exam, which includes checking for microscopic changes to the surface of your cervix (Pap test).  For women ages 21-65, health care providers may recommend a pelvic exam and a Pap test every three years. For women ages 21-65, they may recommend the Pap test and pelvic exam, combined with testing for human papilloma virus (HPV), every five years. Some types of HPV increase your risk of cervical cancer. Testing for HPV may also be done on women of any age who have unclear Pap test results.  Other health care providers may not recommend any screening for nonpregnant women who are considered low risk for pelvic  cancer and have no symptoms. Ask your health care provider if a screening pelvic exam is right for you.  If you have had past treatment for cervical cancer or a condition that could lead to cancer, you need Pap tests and screening for cancer for at least 20 years after your treatment. If Pap tests have been discontinued for you, your risk factors (such as having a new sexual partner) need to be reassessed to determine if you should start having screenings again. Some women have medical problems that increase the chance of getting cervical cancer. In these cases, your health care provider may recommend that you have screening and Pap tests more often.  If you have a family history of uterine cancer or ovarian cancer, talk with your health care provider about genetic screening.  If you have vaginal bleeding after reaching menopause, tell your health care provider.  There are currently no reliable tests available to screen for ovarian cancer.  Lung Cancer Lung cancer screening is recommended for adults 84-17 years old who are at high risk for lung cancer because of a history of smoking. A yearly low-dose CT scan of the lungs is recommended if you:  Currently smoke.  Have a history of at least 30 pack-years of smoking and you currently smoke or have quit within the past 15  years. A pack-year is smoking an average of one pack of cigarettes per day for one year.  Yearly screening should:  Continue until it has been 15 years since you quit.  Stop if you develop a health problem that would prevent you from having lung cancer treatment.  Colorectal Cancer  This type of cancer can be detected and can often be prevented.  Routine colorectal cancer screening usually begins at age 75 and continues through age 3.  If you have risk factors for colon cancer, your health care provider may recommend that you be screened at an earlier age.  If you have a family history of colorectal cancer, talk with  your health care provider about genetic screening.  Your health care provider may also recommend using home test kits to check for hidden blood in your stool.  A small camera at the end of a tube can be used to examine your colon directly (sigmoidoscopy or colonoscopy). This is done to check for the earliest forms of colorectal cancer.  Direct examination of the colon should be repeated every 5-10 years until age 38. However, if early forms of precancerous polyps or small growths are found or if you have a family history or genetic risk for colorectal cancer, you may need to be screened more often.  Skin Cancer  Check your skin from head to toe regularly.  Monitor any moles. Be sure to tell your health care provider: ? About any new moles or changes in moles, especially if there is a change in a mole's shape or color. ? If you have a mole that is larger than the size of a pencil eraser.  If any of your family members has a history of skin cancer, especially at a young age, talk with your health care provider about genetic screening.  Always use sunscreen. Apply sunscreen liberally and repeatedly throughout the day.  Whenever you are outside, protect yourself by wearing long sleeves, pants, a wide-brimmed hat, and sunglasses.  What should I know about osteoporosis? Osteoporosis is a condition in which bone destruction happens more quickly than new bone creation. After menopause, you may be at an increased risk for osteoporosis. To help prevent osteoporosis or the bone fractures that can happen because of osteoporosis, the following is recommended:  If you are 28-43 years old, get at least 1,000 mg of calcium and at least 600 mg of vitamin D per day.  If you are older than age 72 but younger than age 78, get at least 1,200 mg of calcium and at least 600 mg of vitamin D per day.  If you are older than age 80, get at least 1,200 mg of calcium and at least 800 mg of vitamin D per  day.  Smoking and excessive alcohol intake increase the risk of osteoporosis. Eat foods that are rich in calcium and vitamin D, and do weight-bearing exercises several times each week as directed by your health care provider. What should I know about how menopause affects my mental health? Depression may occur at any age, but it is more common as you become older. Common symptoms of depression include:  Low or sad mood.  Changes in sleep patterns.  Changes in appetite or eating patterns.  Feeling an overall lack of motivation or enjoyment of activities that you previously enjoyed.  Frequent crying spells.  Talk with your health care provider if you think that you are experiencing depression. What should I know about immunizations? It is important that  you get and maintain your immunizations. These include:  Tetanus, diphtheria, and pertussis (Tdap) booster vaccine.  Influenza every year before the flu season begins.  Pneumonia vaccine.  Shingles vaccine.  Your health care provider may also recommend other immunizations. This information is not intended to replace advice given to you by your health care provider. Make sure you discuss any questions you have with your health care provider. Document Released: 05/29/2005 Document Revised: 10/25/2015 Document Reviewed: 01/08/2015 Elsevier Interactive Patient Education  2018 [employer].

## 2017-12-07 LAB — CBC WITH DIFFERENTIAL/PLATELET
BASOS PCT: 0.5 % (ref 0.0–3.0)
Basophils Absolute: 0 10*3/uL (ref 0.0–0.1)
EOS PCT: 0.8 % (ref 0.0–5.0)
Eosinophils Absolute: 0 10*3/uL (ref 0.0–0.7)
HCT: 38.9 % (ref 36.0–46.0)
HEMOGLOBIN: 13.1 g/dL (ref 12.0–15.0)
Lymphocytes Relative: 20.1 % (ref 12.0–46.0)
Lymphs Abs: 1.1 10*3/uL (ref 0.7–4.0)
MCHC: 33.7 g/dL (ref 30.0–36.0)
MCV: 87.8 fl (ref 78.0–100.0)
MONOS PCT: 5.5 % (ref 3.0–12.0)
Monocytes Absolute: 0.3 10*3/uL (ref 0.1–1.0)
Neutro Abs: 4 10*3/uL (ref 1.4–7.7)
Neutrophils Relative %: 73.1 % (ref 43.0–77.0)
Platelets: 264 10*3/uL (ref 150.0–400.0)
RBC: 4.43 Mil/uL (ref 3.87–5.11)
RDW: 14.3 % (ref 11.5–15.5)
WBC: 5.4 10*3/uL (ref 4.0–10.5)

## 2017-12-07 LAB — LIPID PANEL
CHOLESTEROL: 198 mg/dL (ref 0–200)
HDL: 71.4 mg/dL (ref >39.00)
LDL Cholesterol: 118 mg/dL — ABNORMAL HIGH (ref 0–99)
NonHDL: 126.1
TRIGLYCERIDES: 40 mg/dL (ref 0.0–149.0)
Total CHOL/HDL Ratio: 3
VLDL: 8 mg/dL (ref 0.0–40.0)

## 2017-12-07 LAB — COMPREHENSIVE METABOLIC PANEL
ALT: 10 U/L (ref 0–35)
AST: 14 U/L (ref 0–37)
Albumin: 4 g/dL (ref 3.5–5.2)
Alkaline Phosphatase: 66 U/L (ref 39–117)
BUN: 15 mg/dL (ref 6–23)
CHLORIDE: 103 meq/L (ref 96–112)
CO2: 30 mEq/L (ref 19–32)
Calcium: 9.6 mg/dL (ref 8.4–10.5)
Creatinine, Ser: 0.74 mg/dL (ref 0.40–1.20)
GFR: 81.37 mL/min (ref >60.00)
GLUCOSE: 79 mg/dL (ref 70–99)
POTASSIUM: 3.4 meq/L — AB (ref 3.5–5.1)
SODIUM: 141 meq/L (ref 135–145)
Total Bilirubin: 0.6 mg/dL (ref 0.2–1.2)
Total Protein: 6.8 g/dL (ref 6.0–8.3)

## 2017-12-07 LAB — TSH: TSH: 1.49 u[IU]/mL (ref 0.35–4.50)

## 2018-01-05 DIAGNOSIS — Z6823 Body mass index (BMI) 23.0-23.9, adult: Secondary | ICD-10-CM | POA: Diagnosis not present

## 2018-01-05 DIAGNOSIS — Z01419 Encounter for gynecological examination (general) (routine) without abnormal findings: Secondary | ICD-10-CM | POA: Diagnosis not present

## 2018-03-10 ENCOUNTER — Other Ambulatory Visit: Payer: Self-pay

## 2018-04-04 ENCOUNTER — Ambulatory Visit (INDEPENDENT_AMBULATORY_CARE_PROVIDER_SITE_OTHER): Payer: Medicare Other

## 2018-04-04 DIAGNOSIS — Z23 Encounter for immunization: Secondary | ICD-10-CM

## 2018-05-04 ENCOUNTER — Encounter: Payer: Medicare Other | Admitting: Internal Medicine

## 2018-05-11 DIAGNOSIS — N958 Other specified menopausal and perimenopausal disorders: Secondary | ICD-10-CM | POA: Diagnosis not present

## 2018-05-11 DIAGNOSIS — M8588 Other specified disorders of bone density and structure, other site: Secondary | ICD-10-CM | POA: Diagnosis not present

## 2018-05-13 ENCOUNTER — Encounter: Payer: Self-pay | Admitting: Internal Medicine

## 2018-05-13 ENCOUNTER — Ambulatory Visit (INDEPENDENT_AMBULATORY_CARE_PROVIDER_SITE_OTHER): Payer: Medicare Other | Admitting: Internal Medicine

## 2018-05-13 VITALS — BP 120/84 | HR 59 | Temp 97.5°F | Ht 62.0 in | Wt 137.3 lb

## 2018-05-13 DIAGNOSIS — I10 Essential (primary) hypertension: Secondary | ICD-10-CM

## 2018-05-13 DIAGNOSIS — E039 Hypothyroidism, unspecified: Secondary | ICD-10-CM

## 2018-05-13 MED ORDER — HYDROCHLOROTHIAZIDE 25 MG PO TABS: 12.5000 mg | ORAL_TABLET | Freq: Every day | ORAL | 3 refills | Status: DC

## 2018-05-13 NOTE — Progress Notes (Signed)
Established Patient Office Visit     CC/Reason for Visit:   Establish care, follow-up chronic medical conditions  HPI: Tiffany Reed is a 78 y.o. female who is coming in today for the above mentioned reasons.  Due for annual physical in August 2020.  Past Medical History is significant for: History of breast cancer in 2005 for which she had a lumpectomy and radiation and completed 5 years of tamoxifen.  She has a history of hypertension that is well controlled on hydrochlorothiazide, also history of hypothyroidism on levothyroxine 75 mcg daily.  She remains very active at her age and appears much younger than her stated age.  She has no acute complaints at today's visit.   Past Medical/Surgical History: Past Medical History:  Diagnosis Date  . Breast cancer ([place]) 2005   right, s/p xrt  . Dysrhythmia    had halter monitor 4/13-all ok for palpatations  . Fluttering heart    and racing over the past several years with lightheadedness  . H/O urinary incontinence   . Hypertension   . Hypothyroidism   . Personal history of radiation therapy     Past Surgical History:  Procedure Laterality Date  . BREAST LUMPECTOMY  2005   right  . EAR CYST EXCISION Left 06/15/2012   Procedure: Left scaphoid cyst excision with distal radius graft;  Surgeon: Wynonia Sours, MD;  Location: [facility];  Service: Orthopedics;  Laterality: Left;  EXCISION CYST LEFT SCAPHOID DISTAL RADIUS GRAFT  . EXCISION MORTON'S NEUROMA  2001   left foot  . GANGLION CYST EXCISION  2005   left thumb  . HAMMER TOE SURGERY  2012   right 5th toe  . HEMORROIDECTOMY  1973  . HYSTEROSCOPY  [id number]  . NEUROPLASTY / TRANSPOSITION MEDIAN NERVE AT CARPAL TUNNEL BILATERAL  2002  . PILONIDAL CYST / SINUS EXCISION  1968  . plantar fibroma     4th toe, right foot  . THYROID LOBECTOMY  2004    Social History:  reports that she has never smoked. She has never used smokeless tobacco. She reports current  alcohol use. She reports that she does not use drugs.  Allergies: Allergies  Allergen Reactions  . Sorbitol     Gas, diarrhea  . Penicillins Rash    Family History:  Family History  Problem Relation Age of Onset  . Colon cancer Neg Hx   . Stomach cancer Neg Hx      Current Outpatient Medications:  .  Cholecalciferol (VITAMIN D) 2000 units CAPS, Take by mouth., Disp: , Rfl:  .  IRON, FERROUS GLUCONATE, PO, Take 22 mg by mouth 2 (two) times a week., Disp: , Rfl:  .  OVER THE COUNTER MEDICATION, Vitamins A - 2500 IU / D-300 IU / E-100 IU / Riboflavin-5 mg / Niacin-30 mg / Iodine-200 mcg / Magnesium-15 mg / Zinc-5 mg / Selenium-30 mcg / Copper-0.1 mg / Manganese-2 mg Takes Daily, Disp: , Rfl:  .  Probiotic Product (PROBIOTIC DAILY PO), Take by mouth. Jarrow-Dophilus EPS, 5 billion per capsule, one capsule Twice Weekly, Disp: , Rfl:  .  SYNTHROID 75 MCG tablet, TAKE 1 TABLET (75 MCG TOTAL) BY MOUTH DAILY., Disp: 90 tablet, Rfl: 4 .  vitamin C (ASCORBIC ACID) 250 MG tablet, Take 250 mg by mouth 2 (two) times a week., Disp: , Rfl:  .  hydrochlorothiazide (HYDRODIURIL) 25 MG tablet, Take 0.5 tablets (12.5 mg total) by mouth daily., Disp: 90 tablet, Rfl:  3  Review of Systems:  Constitutional: Denies fever, chills, diaphoresis, appetite change and fatigue.  HEENT: Denies photophobia, eye pain, redness, hearing loss, ear pain, congestion, sore throat, rhinorrhea, sneezing, mouth sores, trouble swallowing, neck pain, neck stiffness and tinnitus.   Respiratory: Denies SOB, DOE, cough, chest tightness,  and wheezing.   Cardiovascular: Denies chest pain, palpitations and leg swelling.  Gastrointestinal: Denies nausea, vomiting, abdominal pain, diarrhea, constipation, blood in stool and abdominal distention.  Genitourinary: Denies dysuria, urgency, frequency, hematuria, flank pain and difficulty urinating.  Endocrine: Denies: hot or cold intolerance, sweats, changes in hair or nails, polyuria,  polydipsia. Musculoskeletal: Denies myalgias, back pain, joint swelling, arthralgias and gait problem.  Skin: Denies pallor, rash and wound.  Neurological: Denies dizziness, seizures, syncope, weakness, light-headedness, numbness and headaches.  Hematological: Denies adenopathy. Easy bruising, personal or family bleeding history  Psychiatric/Behavioral: Denies suicidal ideation, mood changes, confusion, nervousness, sleep disturbance and agitation    Physical Exam: Vitals:   05/13/18 1551  BP: 120/84  Pulse: (!) 59  Temp: (!) 97.5 F (36.4 C)  TempSrc: Oral  SpO2: 99%  Weight: 137 lb 4.8 oz (62.3 kg)  Height: 5\' 2"  (1.575 m)    Body mass index is 25.11 kg/m.   Constitutional: NAD, calm, comfortable Eyes: PERRL, lids and conjunctivae normal ENMT: Mucous membranes are moist.  Neck: normal, supple, no masses, no thyromegaly Respiratory: clear to auscultation bilaterally, no wheezing, no crackles. Normal respiratory effort. No accessory muscle use.  Cardiovascular: Regular rate and rhythm, no murmurs / rubs / gallops. No extremity edema. 2+ pedal pulses. No carotid bruits.  Musculoskeletal: no clubbing / cyanosis. No joint deformity upper and lower extremities. Good ROM, no contractures. Normal muscle tone.  Neurologic: CN 2-12 grossly intact. Sensation intact, DTR normal. Strength 5/5 in all 4.  Psychiatric: Normal judgment and insight. Alert and oriented x 3. Normal mood.    Impression and Plan:  Acquired hypothyroidism -Last TSH was normal at 1.490 in August 2019, continue current dose of Synthroid.  Essential hypertension  -Well-controlled on 12.5 mg of hydrochlorothiazide. -Requests prescription for 25 mg that she can cut in half so the prescription may last longer.  Remote history of breast cancer -Noted   Patient Instructions  -It was so nice meeting you today!  -Schedule follow up in August for your annual physical and wellness visit (annual wellness with  health coach). Please come in fasting to that appointment.     Lelon Frohlich, MD [facility] at [facility]

## 2018-05-13 NOTE — Patient Instructions (Signed)
-  It was so nice meeting you today!  -Schedule follow up in August for your annual physical and wellness visit (annual wellness with health coach). Please come in fasting to that appointment.

## 2018-05-25 ENCOUNTER — Other Ambulatory Visit: Payer: Self-pay | Admitting: Internal Medicine

## 2018-05-25 DIAGNOSIS — Z1231 Encounter for screening mammogram for malignant neoplasm of breast: Secondary | ICD-10-CM

## 2018-06-20 ENCOUNTER — Ambulatory Visit: Payer: Medicare Other

## 2018-06-30 DIAGNOSIS — H2513 Age-related nuclear cataract, bilateral: Secondary | ICD-10-CM | POA: Diagnosis not present

## 2018-07-11 ENCOUNTER — Ambulatory Visit: Payer: Medicare Other

## 2018-07-20 ENCOUNTER — Ambulatory Visit: Payer: Medicare Other

## 2018-12-14 ENCOUNTER — Ambulatory Visit: Payer: Self-pay | Admitting: *Deleted

## 2018-12-14 NOTE — Telephone Encounter (Signed)
Pt reports  Outbreak of poison ivy Monday. States was around plant Saturday. Area is on arms, hands, under chin. States left hand worse, palm now with fluid filled blisters, "Size of pimples" States arms "Mostly dry rash." Also reports left hand knuckles are swollen. Has been using OTC topical creams and oral Benadryl, which has been effective for itching if used around the clock. Call transferred  to practice, Arbie Cookey, for consideration of appt.. Email and phone # verified.Care advise given per protocol.  Reason for Disposition . Big blisters or oozing sores  Answer Assessment - Initial Assessment Questions 1. APPEARANCE of RASH: "Describe the rash."       2. LOCATION: "Where is the rash located?"      Both hands, left hand and left elbow worse, both arms, 3. SIZE: "How large is the rash?"      Pimple like on palm on left hand, rest dry, few under chin dry 4. ONSET: "When did the rash begin?"      Monday 5. ITCHING: "Does the rash itch?" If so, ask: "How bad is it?"   - MILD - doesn't interfere with normal activities   - MODERATE-SEVERE: interferes with work, school, sleep, or other activities      Moderate to severe, Oral benadryl, aleve, caladryl lotion  Protocols used: POISON IVY - OAK - SUMAC-A-AH

## 2018-12-14 NOTE — Telephone Encounter (Signed)
Patient has an appointment 12/15/2018 at [facility].

## 2018-12-15 ENCOUNTER — Encounter: Payer: Self-pay | Admitting: Adult Health

## 2018-12-15 ENCOUNTER — Other Ambulatory Visit: Payer: Self-pay

## 2018-12-15 ENCOUNTER — Ambulatory Visit (INDEPENDENT_AMBULATORY_CARE_PROVIDER_SITE_OTHER): Payer: Medicare Other | Admitting: Adult Health

## 2018-12-15 VITALS — Temp 97.3°F | Wt 117.0 lb

## 2018-12-15 DIAGNOSIS — L237 Allergic contact dermatitis due to plants, except food: Secondary | ICD-10-CM

## 2018-12-15 MED ORDER — PREDNISONE 10 MG PO TABS: ORAL_TABLET | ORAL | 0 refills | Status: DC

## 2018-12-15 MED ORDER — METHYLPREDNISOLONE ACETATE 80 MG/ML IJ SUSP
80.0000 mg | Freq: Once | INTRAMUSCULAR | Status: AC
  Administered 2018-12-15: 08:00:00 80 mg via INTRAMUSCULAR

## 2018-12-15 NOTE — Progress Notes (Signed)
Subjective:    Patient ID: Tiffany Reed, female    DOB: 12-27-1940, 78 y.o.   MRN: [id number]  HPI 78 year old female who  has a past medical history of Breast cancer ([place]) (2005), Dysrhythmia, Fluttering heart, H/O urinary incontinence, Hypertension, Hypothyroidism, and Personal history of radiation therapy.  She presents to the office today for contact dermatitis from poison ivy. Her symptoms started about 12 days ago after she was working in the yard and noticed that she became entangled in some poison. She has been applying calamine lotion.       Review of Systems See HPI   Past Medical History:  Diagnosis Date  . Breast cancer ([place]) 2005   right, s/p xrt  . Dysrhythmia    had halter monitor 4/13-all ok for palpatations  . Fluttering heart    and racing over the past several years with lightheadedness  . H/O urinary incontinence   . Hypertension   . Hypothyroidism   . Personal history of radiation therapy     Social History   Socioeconomic History  . Marital status: Married    Spouse name: Not on file  . Number of children: Not on file  . Years of education: Not on file  . Highest education level: Not on file  Occupational History  . Not on file  Social Needs  . Financial resource strain: Not on file  . Food insecurity    Worry: Not on file    Inability: Not on file  . Transportation needs    Medical: Not on file    Non-medical: Not on file  Tobacco Use  . Smoking status: Never Smoker  . Smokeless tobacco: Never Used  Substance and Sexual Activity  . Alcohol use: Yes    Comment: rare  . Drug use: No  . Sexual activity: Not on file  Lifestyle  . Physical activity    Days per week: Not on file    Minutes per session: Not on file  . Stress: Not on file  Relationships  . Social Herbalist on phone: Not on file    Gets together: Not on file    Attends religious service: Not on file    Active member of club or organization: Not on file   Attends meetings of clubs or organizations: Not on file    Relationship status: Not on file  . Intimate partner violence    Fear of current or ex partner: Not on file    Emotionally abused: Not on file    Physically abused: Not on file    Forced sexual activity: Not on file  Other Topics Concern  . Not on file  Social History Narrative   Pt lives in [place].   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          Past Surgical History:  Procedure Laterality Date  . BREAST LUMPECTOMY  2005   right  . EAR CYST EXCISION Left 06/15/2012   Procedure: Left scaphoid cyst excision with distal radius graft;  Surgeon: Wynonia Sours, MD;  Location: [facility];  Service: Orthopedics;  Laterality: Left;  EXCISION CYST LEFT SCAPHOID DISTAL RADIUS GRAFT  . EXCISION MORTON'S NEUROMA  2001   left foot  . GANGLION CYST EXCISION  2005   left thumb  . HAMMER TOE SURGERY  2012   right 5th toe  . HEMORROIDECTOMY  1973  . HYSTEROSCOPY  [id number]  . NEUROPLASTY / TRANSPOSITION MEDIAN NERVE AT CARPAL TUNNEL BILATERAL  2002  . PILONIDAL CYST / SINUS EXCISION  1968  . plantar fibroma     4th toe, right foot  . THYROID LOBECTOMY  2004    Family History  Problem Relation Age of Onset  . Colon cancer Neg Hx   . Stomach cancer Neg Hx     Allergies  Allergen Reactions  . Sorbitol     Gas, diarrhea  . Penicillins Rash    Current Outpatient Medications on File Prior to Visit  Medication Sig Dispense Refill  . Cholecalciferol (VITAMIN D) 2000 units CAPS Take by mouth.    . hydrochlorothiazide (HYDRODIURIL) 25 MG tablet Take 0.5 tablets (12.5 mg total) by mouth daily. 90 tablet 3  . IRON, FERROUS GLUCONATE, PO Take 22 mg by mouth 2 (two) times a week.    Marland Kitchen OVER THE COUNTER MEDICATION Vitamins A - 2500 IU / D-300 IU / E-100 IU / Riboflavin-5 mg / Niacin-30 mg / Iodine-200 mcg / Magnesium-15 mg / Zinc-5 mg / Selenium-30 mcg / Copper-0.1 mg / Manganese-2 mg Takes Daily     . Probiotic Product (PROBIOTIC DAILY PO) Take by mouth. Jarrow-Dophilus EPS, 5 billion per capsule, one capsule Twice Weekly    . SYNTHROID 75 MCG tablet TAKE 1 TABLET (75 MCG TOTAL) BY MOUTH DAILY. 90 tablet 4  . vitamin C (ASCORBIC ACID) 250 MG tablet Take 250 mg by mouth 2 (two) times a week.     No current facility-administered medications on file prior to visit.     Temp (!) 97.3 F (36.3 C) (Temporal)   Wt 117 lb (53.1 kg)   BMI 21.40 kg/m       Objective:   Physical Exam Vitals signs and nursing note reviewed.  Constitutional:      Appearance: Normal appearance.  Cardiovascular:     Rate and Rhythm: Normal rate and regular rhythm.     Pulses: Normal pulses.  Skin:    Findings: Rash present.     Comments: Significant poison ivy dermatitis throught out bilateral hands and forearms. Blistering noted throughout. No active signs of infection   Neurological:     Mental Status: She is alert.       Assessment & Plan:  1. Poison ivy dermatitis  - methylPREDNISolone acetate (DEPO-MEDROL) injection 80 mg - predniSONE (DELTASONE) 10 MG tablet; Take two tabs ( 20 mg) for 7 days and 10 mg ( 1 tab) for 7 days  Dispense: 21 tablet; Refill: 0 - Follow up with any signs or symptoms of infection  - Continue to use Calamine lotion   Dorothyann Peng, NP

## 2019-01-09 DIAGNOSIS — Z01419 Encounter for gynecological examination (general) (routine) without abnormal findings: Secondary | ICD-10-CM | POA: Diagnosis not present

## 2019-01-09 DIAGNOSIS — Z6821 Body mass index (BMI) 21.0-21.9, adult: Secondary | ICD-10-CM | POA: Diagnosis not present

## 2019-01-09 DIAGNOSIS — Z1231 Encounter for screening mammogram for malignant neoplasm of breast: Secondary | ICD-10-CM | POA: Diagnosis not present

## 2019-01-10 DIAGNOSIS — E890 Postprocedural hypothyroidism: Secondary | ICD-10-CM | POA: Diagnosis not present

## 2019-01-10 DIAGNOSIS — E041 Nontoxic single thyroid nodule: Secondary | ICD-10-CM | POA: Diagnosis not present

## 2019-02-15 DIAGNOSIS — I8312 Varicose veins of left lower extremity with inflammation: Secondary | ICD-10-CM | POA: Diagnosis not present

## 2019-02-15 DIAGNOSIS — I8311 Varicose veins of right lower extremity with inflammation: Secondary | ICD-10-CM | POA: Diagnosis not present

## 2019-03-07 DIAGNOSIS — I8312 Varicose veins of left lower extremity with inflammation: Secondary | ICD-10-CM | POA: Diagnosis not present

## 2019-03-07 DIAGNOSIS — I8311 Varicose veins of right lower extremity with inflammation: Secondary | ICD-10-CM | POA: Diagnosis not present

## 2019-03-10 DIAGNOSIS — I87323 Chronic venous hypertension (idiopathic) with inflammation of bilateral lower extremity: Secondary | ICD-10-CM | POA: Diagnosis not present

## 2019-04-07 ENCOUNTER — Telehealth: Payer: Self-pay

## 2019-04-07 ENCOUNTER — Other Ambulatory Visit: Payer: Self-pay

## 2019-04-07 ENCOUNTER — Ambulatory Visit (INDEPENDENT_AMBULATORY_CARE_PROVIDER_SITE_OTHER): Payer: Medicare Other

## 2019-04-07 ENCOUNTER — Encounter: Payer: Self-pay | Admitting: Internal Medicine

## 2019-04-07 ENCOUNTER — Ambulatory Visit (INDEPENDENT_AMBULATORY_CARE_PROVIDER_SITE_OTHER): Payer: Medicare Other | Admitting: Internal Medicine

## 2019-04-07 VITALS — BP 140/80 | HR 58 | Temp 97.6°F | Wt 123.1 lb

## 2019-04-07 DIAGNOSIS — W19XXXA Unspecified fall, initial encounter: Secondary | ICD-10-CM

## 2019-04-07 DIAGNOSIS — M25532 Pain in left wrist: Secondary | ICD-10-CM

## 2019-04-07 NOTE — Telephone Encounter (Signed)
Copied from [place] [id number]. Topic: General - Other >> Apr 07, 2019  2:42 PM Leward Quan A wrote: Reason for CRM: Patient called to says that she was caught in a traffic jam on [address] by the [employer] fare but she is trying her best to get there as soon as possible.

## 2019-04-07 NOTE — Progress Notes (Signed)
Acute Office Visit     This visit occurred during the SARS-CoV-2 public health emergency.  Safety protocols were in place, including screening questions prior to the visit, additional usage of staff PPE, and extensive cleaning of exam room while observing appropriate contact time as indicated for disinfecting solutions.    CC/Reason for Visit: fall with wrist pain  HPI: Tiffany Reed is a 78 y.o. female who is coming in today for the above mentioned reasons. She suffered a mechanical fall at 2 pm today after she everted her ankle while wearing [employer]. She fell onto the back of her left wrist and her left hip. She had immediate wrist deformity in the area of the distal radius with hematoma formation. Hip is only minimally tender and able to bear weight without issues.   Past Medical/Surgical History: Past Medical History:  Diagnosis Date  . Breast cancer ([place]) 2005   right, s/p xrt  . Dysrhythmia    had halter monitor 4/13-all ok for palpatations  . Fluttering heart    and racing over the past several years with lightheadedness  . H/O urinary incontinence   . Hypertension   . Hypothyroidism   . Personal history of radiation therapy     Past Surgical History:  Procedure Laterality Date  . BREAST LUMPECTOMY  2005   right  . EAR CYST EXCISION Left 06/15/2012   Procedure: Left scaphoid cyst excision with distal radius graft;  Surgeon: Wynonia Sours, MD;  Location: [facility];  Service: Orthopedics;  Laterality: Left;  EXCISION CYST LEFT SCAPHOID DISTAL RADIUS GRAFT  . EXCISION MORTON'S NEUROMA  2001   left foot  . GANGLION CYST EXCISION  2005   left thumb  . HAMMER TOE SURGERY  2012   right 5th toe  . HEMORROIDECTOMY  1973  . HYSTEROSCOPY  [id number]  . NEUROPLASTY / TRANSPOSITION MEDIAN NERVE AT CARPAL TUNNEL BILATERAL  2002  . PILONIDAL CYST / SINUS EXCISION  1968  . plantar fibroma     4th toe, right foot  . THYROID LOBECTOMY  2004    Social  History:  reports that she has never smoked. She has never used smokeless tobacco. She reports current alcohol use. She reports that she does not use drugs.  Allergies: Allergies  Allergen Reactions  . Sorbitol     Gas, diarrhea  . Penicillins Rash    Family History:  Family History  Problem Relation Age of Onset  . Colon cancer Neg Hx   . Stomach cancer Neg Hx      Current Outpatient Medications:  .  Cholecalciferol (VITAMIN D) 2000 units CAPS, Take by mouth., Disp: , Rfl:  .  hydrochlorothiazide (HYDRODIURIL) 25 MG tablet, Take 0.5 tablets (12.5 mg total) by mouth daily., Disp: 90 tablet, Rfl: 3 .  IRON, FERROUS GLUCONATE, PO, Take 22 mg by mouth 2 (two) times a week., Disp: , Rfl:  .  OVER THE COUNTER MEDICATION, Vitamins A - 2500 IU / D-300 IU / E-100 IU / Riboflavin-5 mg / Niacin-30 mg / Iodine-200 mcg / Magnesium-15 mg / Zinc-5 mg / Selenium-30 mcg / Copper-0.1 mg / Manganese-2 mg Takes Daily, Disp: , Rfl:  .  predniSONE (DELTASONE) 10 MG tablet, Take two tabs ( 20 mg) for 7 days and 10 mg ( 1 tab) for 7 days, Disp: 21 tablet, Rfl: 0 .  Probiotic Product (PROBIOTIC DAILY PO), Take by mouth. Jarrow-Dophilus EPS, 5 billion per capsule, one capsule Twice Weekly,  Disp: , Rfl:  .  SYNTHROID 75 MCG tablet, TAKE 1 TABLET (75 MCG TOTAL) BY MOUTH DAILY., Disp: 90 tablet, Rfl: 4 .  vitamin C (ASCORBIC ACID) 250 MG tablet, Take 250 mg by mouth 2 (two) times a week., Disp: , Rfl:   Review of Systems:  Constitutional: Denies fever, chills, diaphoresis, appetite change and fatigue.  HEENT: Denies photophobia, eye pain, redness, hearing loss, ear pain, congestion, sore throat, rhinorrhea, sneezing, mouth sores, trouble swallowing, neck pain, neck stiffness and tinnitus.   Respiratory: Denies SOB, DOE, cough, chest tightness,  and wheezing.   Cardiovascular: Denies chest pain, palpitations and leg swelling.  Gastrointestinal: Denies nausea, vomiting, abdominal pain, diarrhea, constipation,  blood in stool and abdominal distention.  Genitourinary: Denies dysuria, urgency, frequency, hematuria, flank pain and difficulty urinating.  Endocrine: Denies: hot or cold intolerance, sweats, changes in hair or nails, polyuria, polydipsia. Musculoskeletal: Denies myalgias, back pain, arthralgias and gait problem.  Skin: Denies pallor, rash and wound.  Neurological: Denies dizziness, seizures, syncope, weakness, light-headedness, numbness and headaches.  Hematological: Denies adenopathy. Easy bruising, personal or family bleeding history  Psychiatric/Behavioral: Denies suicidal ideation, mood changes, confusion, nervousness, sleep disturbance and agitation    Physical Exam: Vitals:   04/07/19 1506  BP: 140/80  Pulse: (!) 58  Temp: 97.6 F (36.4 C)  TempSrc: Temporal  SpO2: 98%  Weight: 123 lb 1.6 oz (55.8 kg)    Body mass index is 22.52 kg/m.   Constitutional: NAD, calm, comfortable Eyes: PERRL, lids and conjunctivae normal ENMT: Mucous membranes are moist.  Musculoskeletal: obvious deformity of the left wrist with hematoma formation.  Psychiatric: Normal judgment and insight. Alert and oriented x 3. Normal mood.    Impression and Plan:  Fall, initial encounter  Left wrist pain  -Suspect distal radius fracture. -Send for STAT Xray. -She will be given information for after hours ortho clinic.      Lelon Frohlich, MD [facility]

## 2019-04-07 NOTE — Telephone Encounter (Signed)
Patient returned call to the office, gave patient x-ray results and advised to go to after hours ortho. Patient verbalized understanding.

## 2019-04-20 ENCOUNTER — Other Ambulatory Visit: Payer: Self-pay | Admitting: *Deleted

## 2019-04-20 DIAGNOSIS — I10 Essential (primary) hypertension: Secondary | ICD-10-CM

## 2019-04-20 MED ORDER — HYDROCHLOROTHIAZIDE 25 MG PO TABS: 12.5000 mg | ORAL_TABLET | Freq: Every day | ORAL | 0 refills | Status: DC

## 2019-05-10 DIAGNOSIS — S52502D Unspecified fracture of the lower end of left radius, subsequent encounter for closed fracture with routine healing: Secondary | ICD-10-CM | POA: Diagnosis not present

## 2019-05-16 ENCOUNTER — Ambulatory Visit: Payer: Medicare Other

## 2019-05-25 ENCOUNTER — Ambulatory Visit: Payer: Medicare Other | Attending: Internal Medicine

## 2019-05-25 DIAGNOSIS — Z23 Encounter for immunization: Secondary | ICD-10-CM | POA: Insufficient documentation

## 2019-05-25 NOTE — Progress Notes (Signed)
   Covid-19 Vaccination Clinic  Name:  Tiffany Reed    MRN: [id number] DOB: 26-Oct-1940  05/25/2019  Ms. Maura was observed post Covid-19 immunization for 15 minutes without incidence. She was provided with Vaccine Information Sheet and instruction to access the V-Safe system.   Ms. Draves was instructed to call 911 with any severe reactions post vaccine: Marland Kitchen Difficulty breathing  . Swelling of your face and throat  . A fast heartbeat  . A bad rash all over your body  . Dizziness and weakness    Immunizations Administered    Name Date Dose VIS Date Route   Pfizer COVID-19 Vaccine 05/25/2019  4:44 PM 0.3 mL 03/31/2019 Intramuscular   Manufacturer: [place]   Lot: [id number]   [place]: [id number]

## 2019-06-20 ENCOUNTER — Ambulatory Visit: Payer: Medicare Other | Attending: Internal Medicine

## 2019-06-20 DIAGNOSIS — Z23 Encounter for immunization: Secondary | ICD-10-CM | POA: Insufficient documentation

## 2019-06-20 NOTE — Progress Notes (Signed)
   Covid-19 Vaccination Clinic  Name:  Tiffany Reed    MRN: [id number] DOB: 1941/01/13  06/20/2019  Tiffany Reed was observed post Covid-19 immunization for 15 minutes without incident. She was provided with Vaccine Information Sheet and instruction to access the V-Safe system.   Tiffany Reed was instructed to call 911 with any severe reactions post vaccine: Marland Kitchen Difficulty breathing  . Swelling of face and throat  . A fast heartbeat  . A bad rash all over body  . Dizziness and weakness   Immunizations Administered    Name Date Dose VIS Date Route   Pfizer COVID-19 Vaccine 06/20/2019  2:33 PM 0.3 mL 03/31/2019 Intramuscular   Manufacturer: [place]   Lot: [id number]   [place]: [id number]

## 2019-07-03 ENCOUNTER — Telehealth: Payer: Self-pay | Admitting: Internal Medicine

## 2019-07-03 NOTE — Telephone Encounter (Signed)
Pt use to take the 25mg  of the Hydrochlorothiazide and then she was switched to the 12.5MG  capsules. Pt would like it refilled as the 12.5mg  capsules (not tablets). She has a couple weeks worth left. Pt states it doesn't have to be refilled now, it can wait until next week.   Pharmacy: [facility] [address]  Joylene Igo: [phone]

## 2019-07-04 NOTE — Telephone Encounter (Signed)
Okay to switch?  

## 2019-07-05 NOTE — Telephone Encounter (Signed)
Just to make sure I understand: she wants capsules not tablets? If so, ok to switch

## 2019-07-05 NOTE — Telephone Encounter (Signed)
Left message on machine for patient to return our call.  More information needed about changing medication.

## 2019-07-11 NOTE — Telephone Encounter (Signed)
Attempted to call patient but there was a bad connection.

## 2019-07-12 MED ORDER — HYDROCHLOROTHIAZIDE 12.5 MG PO CAPS: 12.5000 mg | ORAL_CAPSULE | Freq: Every day | ORAL | 1 refills | Status: DC

## 2019-07-12 NOTE — Addendum Note (Signed)
Addended by: Westley Hummer B on: 07/12/2019 12:38 PM   Modules accepted: Orders

## 2019-07-12 NOTE — Telephone Encounter (Signed)
Spoke with patient and she prefers 12.5 mg capsules.  New Rx sent.

## 2019-08-16 DIAGNOSIS — H00011 Hordeolum externum right upper eyelid: Secondary | ICD-10-CM | POA: Diagnosis not present

## 2019-08-16 DIAGNOSIS — H5213 Myopia, bilateral: Secondary | ICD-10-CM | POA: Diagnosis not present

## 2019-08-16 DIAGNOSIS — H524 Presbyopia: Secondary | ICD-10-CM | POA: Diagnosis not present

## 2019-08-16 DIAGNOSIS — H52223 Regular astigmatism, bilateral: Secondary | ICD-10-CM | POA: Diagnosis not present

## 2019-08-22 DIAGNOSIS — H0011 Chalazion right upper eyelid: Secondary | ICD-10-CM | POA: Diagnosis not present

## 2019-11-29 DIAGNOSIS — H524 Presbyopia: Secondary | ICD-10-CM | POA: Diagnosis not present

## 2019-11-29 DIAGNOSIS — H5211 Myopia, right eye: Secondary | ICD-10-CM | POA: Diagnosis not present

## 2019-11-29 DIAGNOSIS — H52223 Regular astigmatism, bilateral: Secondary | ICD-10-CM | POA: Diagnosis not present

## 2019-11-29 DIAGNOSIS — H2513 Age-related nuclear cataract, bilateral: Secondary | ICD-10-CM | POA: Diagnosis not present

## 2019-12-19 DIAGNOSIS — L817 Pigmented purpuric dermatosis: Secondary | ICD-10-CM | POA: Diagnosis not present

## 2019-12-19 DIAGNOSIS — L820 Inflamed seborrheic keratosis: Secondary | ICD-10-CM | POA: Diagnosis not present

## 2020-01-03 ENCOUNTER — Other Ambulatory Visit: Payer: Self-pay | Admitting: Internal Medicine

## 2020-01-22 DIAGNOSIS — H5211 Myopia, right eye: Secondary | ICD-10-CM | POA: Diagnosis not present

## 2020-01-22 DIAGNOSIS — H00024 Hordeolum internum left upper eyelid: Secondary | ICD-10-CM | POA: Diagnosis not present

## 2020-01-22 DIAGNOSIS — H52223 Regular astigmatism, bilateral: Secondary | ICD-10-CM | POA: Diagnosis not present

## 2020-01-22 DIAGNOSIS — H524 Presbyopia: Secondary | ICD-10-CM | POA: Diagnosis not present

## 2020-02-22 ENCOUNTER — Ambulatory Visit (INDEPENDENT_AMBULATORY_CARE_PROVIDER_SITE_OTHER): Payer: Medicare Other | Admitting: *Deleted

## 2020-02-22 ENCOUNTER — Other Ambulatory Visit: Payer: Self-pay

## 2020-02-22 DIAGNOSIS — Z23 Encounter for immunization: Secondary | ICD-10-CM

## 2020-02-26 ENCOUNTER — Telehealth: Payer: Self-pay | Admitting: Internal Medicine

## 2020-02-26 MED ORDER — SYNTHROID 75 MCG PO TABS: ORAL_TABLET | ORAL | 0 refills | Status: DC

## 2020-02-26 NOTE — Telephone Encounter (Signed)
Pt is calling in stating that she is needing a refill on Rx Levothyroxine 75 MG stating that her endocrinologist is the one that put her on this medication it is not on her medication list.  She is only needing enough up to her appointment on 03/19/2020 @ 2:30 p.m.  [facility]

## 2020-03-06 DIAGNOSIS — Z23 Encounter for immunization: Secondary | ICD-10-CM | POA: Diagnosis not present

## 2020-03-19 ENCOUNTER — Ambulatory Visit: Payer: Medicare Other | Admitting: Internal Medicine

## 2020-03-28 ENCOUNTER — Other Ambulatory Visit: Payer: Self-pay | Admitting: Internal Medicine

## 2020-04-01 DIAGNOSIS — Z6822 Body mass index (BMI) 22.0-22.9, adult: Secondary | ICD-10-CM | POA: Diagnosis not present

## 2020-04-01 DIAGNOSIS — Z124 Encounter for screening for malignant neoplasm of cervix: Secondary | ICD-10-CM | POA: Diagnosis not present

## 2020-04-02 ENCOUNTER — Other Ambulatory Visit: Payer: Self-pay | Admitting: Obstetrics and Gynecology

## 2020-04-02 DIAGNOSIS — N644 Mastodynia: Secondary | ICD-10-CM

## 2020-04-02 DIAGNOSIS — E041 Nontoxic single thyroid nodule: Secondary | ICD-10-CM

## 2020-04-05 ENCOUNTER — Other Ambulatory Visit: Payer: Medicare Other

## 2020-04-22 ENCOUNTER — Other Ambulatory Visit: Payer: Self-pay | Admitting: Internal Medicine

## 2020-04-29 ENCOUNTER — Ambulatory Visit
Admission: RE | Admit: 2020-04-29 | Discharge: 2020-04-29 | Disposition: A | Discharge status: Home / Self Care | Payer: Medicare Other | Source: Ambulatory Visit | Attending: Obstetrics and Gynecology | Admitting: Obstetrics and Gynecology

## 2020-04-29 DIAGNOSIS — E079 Disorder of thyroid, unspecified: Secondary | ICD-10-CM | POA: Diagnosis not present

## 2020-04-29 DIAGNOSIS — E041 Nontoxic single thyroid nodule: Secondary | ICD-10-CM

## 2020-05-08 DIAGNOSIS — N644 Mastodynia: Secondary | ICD-10-CM | POA: Diagnosis not present

## 2020-05-23 DIAGNOSIS — N958 Other specified menopausal and perimenopausal disorders: Secondary | ICD-10-CM | POA: Diagnosis not present

## 2020-05-23 DIAGNOSIS — M816 Localized osteoporosis [Lequesne]: Secondary | ICD-10-CM | POA: Diagnosis not present

## 2020-06-21 ENCOUNTER — Other Ambulatory Visit: Payer: Medicare Other

## 2020-06-21 ENCOUNTER — Ambulatory Visit: Payer: Medicare Other

## 2020-06-21 ENCOUNTER — Ambulatory Visit
Admission: RE | Admit: 2020-06-21 | Discharge: 2020-06-21 | Disposition: A | Discharge status: Home / Self Care | Payer: Medicare Other | Source: Ambulatory Visit | Attending: Obstetrics and Gynecology | Admitting: Obstetrics and Gynecology

## 2020-06-21 ENCOUNTER — Other Ambulatory Visit: Payer: Self-pay

## 2020-06-21 DIAGNOSIS — N644 Mastodynia: Secondary | ICD-10-CM

## 2020-06-21 DIAGNOSIS — Z853 Personal history of malignant neoplasm of breast: Secondary | ICD-10-CM | POA: Diagnosis not present

## 2020-06-21 DIAGNOSIS — R922 Inconclusive mammogram: Secondary | ICD-10-CM | POA: Diagnosis not present

## 2020-06-26 ENCOUNTER — Other Ambulatory Visit: Payer: Self-pay | Admitting: Obstetrics and Gynecology

## 2020-06-26 DIAGNOSIS — Z853 Personal history of malignant neoplasm of breast: Secondary | ICD-10-CM

## 2020-08-01 ENCOUNTER — Encounter: Payer: Self-pay | Admitting: Internal Medicine

## 2020-08-01 ENCOUNTER — Other Ambulatory Visit: Payer: Self-pay

## 2020-08-01 ENCOUNTER — Ambulatory Visit (INDEPENDENT_AMBULATORY_CARE_PROVIDER_SITE_OTHER): Payer: Medicare Other | Admitting: Internal Medicine

## 2020-08-01 VITALS — BP 110/60 | HR 61 | Temp 97.6°F | Ht 61.5 in | Wt 127.8 lb

## 2020-08-01 DIAGNOSIS — E039 Hypothyroidism, unspecified: Secondary | ICD-10-CM | POA: Diagnosis not present

## 2020-08-01 DIAGNOSIS — I10 Essential (primary) hypertension: Secondary | ICD-10-CM | POA: Diagnosis not present

## 2020-08-01 DIAGNOSIS — Z0000 Encounter for general adult medical examination without abnormal findings: Secondary | ICD-10-CM | POA: Diagnosis not present

## 2020-08-01 NOTE — Patient Instructions (Signed)
-Nice seeing you today!!  -Lab work today; will notify you once results are available.  -Schedule your fourth COVID vaccine.  -Schedule follow up with me in 1 year or sooner as needed.   Preventive Care 41 Years and Older, Female Preventive care refers to lifestyle choices and visits with your health care provider that can promote health and wellness. This includes:  A yearly physical exam. This is also called an annual wellness visit.  Regular dental and eye exams.  Immunizations.  Screening for certain conditions.  Healthy lifestyle choices, such as: ? Eating a healthy diet. ? Getting regular exercise. ? Not using drugs or products that contain nicotine and tobacco. ? Limiting alcohol use. What can I expect for my preventive care visit? Physical exam Your health care provider will check your:  Height and weight. These may be used to calculate your BMI (body mass index). BMI is a measurement that tells if you are at a healthy weight.  Heart rate and blood pressure.  Body temperature.  Skin for abnormal spots. Counseling Your health care provider may ask you questions about your:  Past medical problems.  Family's medical history.  Alcohol, tobacco, and drug use.  Emotional well-being.  Home life and relationship well-being.  Sexual activity.  Diet, exercise, and sleep habits.  History of falls.  Memory and ability to understand (cognition).  Work and work Statistician.  Pregnancy and menstrual history.  Access to firearms. What immunizations do I need? Vaccines are usually given at various ages, according to a schedule. Your health care provider will recommend vaccines for you based on your age, medical history, and lifestyle or other factors, such as travel or where you work.   What tests do I need? Blood tests  Lipid and cholesterol levels. These may be checked every 5 years, or more often depending on your overall health.  Hepatitis C  test.  Hepatitis B test. Screening  Lung cancer screening. You may have this screening every year starting at age 40 if you have a 30-pack-year history of smoking and currently smoke or have quit within the past 15 years.  Colorectal cancer screening. ? All adults should have this screening starting at age 8 and continuing until age 78. ? Your health care provider may recommend screening at age 69 if you are at increased risk. ? You will have tests every 1-10 years, depending on your results and the type of screening test.  Diabetes screening. ? This is done by checking your blood sugar (glucose) after you have not eaten for a while (fasting). ? You may have this done every 1-3 years.  Mammogram. ? This may be done every 1-2 years. ? Talk with your health care provider about how often you should have regular mammograms.  Abdominal aortic aneurysm (AAA) screening. You may need this if you are a current or former smoker.  BRCA-related cancer screening. This may be done if you have a family history of breast, ovarian, tubal, or peritoneal cancers. Other tests  STD (sexually transmitted disease) testing, if you are at risk.  Bone density scan. This is done to screen for osteoporosis. You may have this done starting at age 50. Talk with your health care provider about your test results, treatment options, and if necessary, the need for more tests. Follow these instructions at home: Eating and drinking  Eat a diet that includes fresh fruits and vegetables, whole grains, lean protein, and low-fat dairy products. Limit your intake of foods with high  amounts of sugar, saturated fats, and salt.  Take vitamin and mineral supplements as recommended by your health care provider.  Do not drink alcohol if your health care provider tells you not to drink.  If you drink alcohol: ? Limit how much you have to 0-1 drink a day. ? Be aware of how much alcohol is in your drink. In the [place] one  drink equals one 12 oz bottle of beer (355 mL), one 5 oz glass of wine (148 mL), or one 1 oz glass of hard liquor (44 mL).   Lifestyle  Take daily care of your teeth and gums. Brush your teeth every morning and night with fluoride toothpaste. Floss one time each day.  Stay active. Exercise for at least 30 minutes 5 or more days each week.  Do not use any products that contain nicotine or tobacco, such as cigarettes, e-cigarettes, and chewing tobacco. If you need help quitting, ask your health care provider.  Do not use drugs.  If you are sexually active, practice safe sex. Use a condom or other form of protection in order to prevent STIs (sexually transmitted infections).  Talk with your health care provider about taking a low-dose aspirin or statin.  Find healthy ways to cope with stress, such as: ? Meditation, yoga, or listening to music. ? Journaling. ? Talking to a trusted person. ? Spending time with friends and family. Safety  Always wear your seat belt while driving or riding in a vehicle.  Do not drive: ? If you have been drinking alcohol. Do not ride with someone who has been drinking. ? When you are tired or distracted. ? While texting.  Wear a helmet and other protective equipment during sports activities.  If you have firearms in your house, make sure you follow all gun safety procedures. What's next?  Visit your health care provider once a year for an annual wellness visit.  Ask your health care provider how often you should have your eyes and teeth checked.  Stay up to date on all vaccines. This information is not intended to replace advice given to you by your health care provider. Make sure you discuss any questions you have with your health care provider. Document Revised: 03/27/2020 Document Reviewed: 03/31/2018 Elsevier Patient Education  2021 [employer].

## 2020-08-01 NOTE — Progress Notes (Signed)
Established Patient Office Visit     This visit occurred during the SARS-CoV-2 public health emergency.  Safety protocols were in place, including screening questions prior to the visit, additional usage of staff PPE, and extensive cleaning of exam room while observing appropriate contact time as indicated for disinfecting solutions.    CC/Reason for Visit: Annual preventive exam and subsequent Medicare wellness visit  HPI: Tiffany Reed is a 80 y.o. female who is coming in today for the above mentioned reasons.  She has a remarkable 80 year old who looks much younger than her stated age.  She has a history of breast cancer in 2005 for which she had lumpectomy and radiation and completed 5 years of tamoxifen.  She has a history of hypertension that is well controlled on hydrochlorothiazide.  Hypothyroidism on levothyroxine.  She remains very active.  She has routine dental care, is overdue for eye care.  She is due to have her fourth Covid vaccine but otherwise they are up-to-date.  Her last colonoscopy was in 2013, her last mammogram was earlier this month.  DEXA scans and mammograms are ordered through her gynecologist.  Past Medical/Surgical History: Past Medical History:  Diagnosis Date  . Breast cancer (HCC) 2005   right, s/p xrt  . Dysrhythmia    had halter monitor 4/13-all ok for palpatations  . Fluttering heart    and racing over the past several years with lightheadedness  . H/O urinary incontinence   . Hypertension   . Hypothyroidism   . Osteopenia   . Personal history of radiation therapy     Past Surgical History:  Procedure Laterality Date  . BREAST LUMPECTOMY  2005   right  . EAR CYST EXCISION Left 06/15/2012   Procedure: Left scaphoid cyst excision with distal radius graft;  Surgeon: Nicki Reaper, MD;  Location: [facility];  Service: Orthopedics;  Laterality: Left;  EXCISION CYST LEFT SCAPHOID DISTAL RADIUS GRAFT  . EXCISION MORTON'S NEUROMA   2001   left foot  . GANGLION CYST EXCISION  2005   left thumb  . HAMMER TOE SURGERY  2012   right 5th toe  . HEMORROIDECTOMY  1973  . HYSTEROSCOPY  [id number]  . NEUROPLASTY / TRANSPOSITION MEDIAN NERVE AT CARPAL TUNNEL BILATERAL  2002  . PILONIDAL CYST / SINUS EXCISION  1968  . plantar fibroma     4th toe, right foot  . THYROID LOBECTOMY  2004    Social History:  reports that she has never smoked. She has never used smokeless tobacco. She reports current alcohol use. She reports that she does not use drugs.  Allergies: Allergies  Allergen Reactions  . Sorbitol     Gas, diarrhea  . Penicillins Rash    Family History:  Family History  Problem Relation Age of Onset  . Colon cancer Neg Hx   . Stomach cancer Neg Hx      Current Outpatient Medications:  .  Cholecalciferol (VITAMIN D) 2000 units CAPS, Take by mouth., Disp: , Rfl:  .  hydrochlorothiazide (MICROZIDE) 12.5 MG capsule, TAKE 1 CAPSULE BY MOUTH EVERY DAY, Disp: 30 capsule, Rfl: 0 .  ibandronate (BONIVA) 150 MG tablet, Take 150 mg by mouth every 30 (thirty) days. Take in the morning with a full glass of water, on an empty stomach, and do not take anything else by mouth or lie down for the next 30 min., Disp: , Rfl:  .  IRON, FERROUS GLUCONATE, PO, Take 22 mg  by mouth 2 (two) times a week., Disp: , Rfl:  .  levothyroxine (SYNTHROID) 75 MCG tablet, Take 75 mcg by mouth daily before breakfast., Disp: , Rfl:  .  OVER THE COUNTER MEDICATION, Vitamins A - 2500 IU / D-300 IU / E-100 IU / Riboflavin-5 mg / Niacin-30 mg / Iodine-200 mcg / Magnesium-15 mg / Zinc-5 mg / Selenium-30 mcg / Copper-0.1 mg / Manganese-2 mg Takes Daily, Disp: , Rfl:  .  Probiotic Product (PROBIOTIC DAILY PO), Take by mouth. Jarrow-Dophilus EPS, 5 billion per capsule, one capsule Twice Weekly, Disp: , Rfl:  .  vitamin C (ASCORBIC ACID) 250 MG tablet, Take 250 mg by mouth 2 (two) times a week., Disp: , Rfl:   Review of Systems:  Constitutional: Denies  fever, chills, diaphoresis, appetite change and fatigue.  HEENT: Denies photophobia, eye pain, redness, hearing loss, ear pain, congestion, sore throat, rhinorrhea, sneezing, mouth sores, trouble swallowing, neck pain, neck stiffness and tinnitus.   Respiratory: Denies SOB, DOE, cough, chest tightness,  and wheezing.   Cardiovascular: Denies chest pain, palpitations and leg swelling.  Gastrointestinal: Denies nausea, vomiting, abdominal pain, diarrhea, constipation, blood in stool and abdominal distention.  Genitourinary: Denies dysuria, urgency, frequency, hematuria, flank pain and difficulty urinating.  Endocrine: Denies: hot or cold intolerance, sweats, changes in hair or nails, polyuria, polydipsia. Musculoskeletal: Denies myalgias, back pain, joint swelling, arthralgias and gait problem.  Skin: Denies pallor, rash and wound.  Neurological: Denies dizziness, seizures, syncope, weakness, light-headedness, numbness and headaches.  Hematological: Denies adenopathy. Easy bruising, personal or family bleeding history  Psychiatric/Behavioral: Denies suicidal ideation, mood changes, confusion, nervousness, sleep disturbance and agitation    Physical Exam: Vitals:   08/01/20 1412  BP: 110/60  Pulse: 61  Temp: 97.6 F (36.4 C)  TempSrc: Oral  SpO2: 99%  Weight: 127 lb 12.8 oz (58 kg)  Height: 5' 1.5" (1.562 m)    Body mass index is 23.76 kg/m.   Constitutional: NAD, calm, comfortable Eyes: PERRL, lids and conjunctivae normal ENMT: Mucous membranes are moist. Posterior pharynx clear of any exudate or lesions. Normal dentition. Tympanic membrane is pearly white, no erythema or bulging. Neck: normal, supple, no masses, no thyromegaly Respiratory: clear to auscultation bilaterally, no wheezing, no crackles. Normal respiratory effort. No accessory muscle use.  Cardiovascular: Regular rate and rhythm, no murmurs / rubs / gallops. No extremity edema. 2+ pedal pulses. No carotid bruits.   Abdomen: no tenderness, no masses palpated. No hepatosplenomegaly. Bowel sounds positive.  Musculoskeletal: no clubbing / cyanosis. No joint deformity upper and lower extremities. Good ROM, no contractures. Normal muscle tone.  Skin: no rashes, lesions, ulcers. No induration Neurologic: CN 2-12 grossly intact. Sensation intact, DTR normal. Strength 5/5 in all 4.  Psychiatric: Normal judgment and insight. Alert and oriented x 3. Normal mood.    Subsequent Medicare wellness visit   1. Risk factors, based on past  M,S,F -cardiovascular disease risk factors include age, history of hypertension   2.  Physical activities: She remains very active on a daily basis   3.  Depression/mood:  Stable, not depressed   4.  Hearing:  No perceived issues   5.  ADL's: Independent in all ADLs   6.  Fall risk:  Low fall risk   7.  Home safety: No problems identified   8.  Height weight, and visual acuity: height and weight as above, vision:   Visual Acuity Screening   Right eye Left eye Both eyes  Without correction:  With correction: na 20/25 20/25     9.  Counseling:  Advised to get her COVID vaccination   10. Lab orders based on risk factors: Laboratory update will be reviewed   11. Referral :  None today   12. Care plan:  Follow-up with me in 6 to 12 months   13. Cognitive assessment:  No cognitive impairment   14. Screening: Patient provided with a written and personalized 5-10 year screening schedule in the AVS.   yes   15. Provider List Update:   PCP, GYN Dr. Radene Knee  16. Advance Directives: Full code   17. Opioids: Patient is not on any opioid prescriptions and has no risk factors for a substance use disorder.   [place] Office Visit from 08/01/2020 in [place] at [place]  PHQ-9 Total Score 0      Fall Risk  08/01/2020 04/07/2019 05/13/2018 03/10/2018 12/18/2014  Falls in the past year? 1 1 0 0 Yes  Comment - - - Emmi Telephone Survey: data to providers  prior to load -  Number falls in past yr: 0 0 0 - 1  Injury with Fall? 1 1 0 - No  Risk for fall due to : - - - - Impaired balance/gait     Impression and Plan:  Encounter for preventive health examination -Advised routine eye and dental care. -Due for her fourth Covid vaccine, otherwise immunizations are up-to-date and age-appropriate. -Screening labs today. -Healthy lifestyle discussed in detail. -DEXA scan and mammogram were ordered recently by GYN. -She will be due for repeat colonoscopy in 2023.  Essential hypertension  -Well-controlled on hydrochlorothiazide 12.5 mg daily.  Acquired hypothyroidism  - Plan: TSH -She is currently on levothyroxine 75 mcg daily.    Patient Instructions   -Nice seeing you today!!  -Lab work today; will notify you once results are available.  -Schedule your fourth COVID vaccine.  -Schedule follow up with me in 1 year or sooner as needed.   Preventive Care 82 Years and Older, Female Preventive care refers to lifestyle choices and visits with your health care provider that can promote health and wellness. This includes:  A yearly physical exam. This is also called an annual wellness visit.  Regular dental and eye exams.  Immunizations.  Screening for certain conditions.  Healthy lifestyle choices, such as: ? Eating a healthy diet. ? Getting regular exercise. ? Not using drugs or products that contain nicotine and tobacco. ? Limiting alcohol use. What can I expect for my preventive care visit? Physical exam Your health care provider will check your:  Height and weight. These may be used to calculate your BMI (body mass index). BMI is a measurement that tells if you are at a healthy weight.  Heart rate and blood pressure.  Body temperature.  Skin for abnormal spots. Counseling Your health care provider may ask you questions about your:  Past medical problems.  Family's medical history.  Alcohol, tobacco, and drug  use.  Emotional well-being.  Home life and relationship well-being.  Sexual activity.  Diet, exercise, and sleep habits.  History of falls.  Memory and ability to understand (cognition).  Work and work Statistician.  Pregnancy and menstrual history.  Access to firearms. What immunizations do I need? Vaccines are usually given at various ages, according to a schedule. Your health care provider will recommend vaccines for you based on your age, medical history, and lifestyle or other factors, such as travel or where you work.   What tests  do I need? Blood tests  Lipid and cholesterol levels. These may be checked every 5 years, or more often depending on your overall health.  Hepatitis C test.  Hepatitis B test. Screening  Lung cancer screening. You may have this screening every year starting at age 53 if you have a 30-pack-year history of smoking and currently smoke or have quit within the past 15 years.  Colorectal cancer screening. ? All adults should have this screening starting at age 68 and continuing until age 93. ? Your health care provider may recommend screening at age 77 if you are at increased risk. ? You will have tests every 1-10 years, depending on your results and the type of screening test.  Diabetes screening. ? This is done by checking your blood sugar (glucose) after you have not eaten for a while (fasting). ? You may have this done every 1-3 years.  Mammogram. ? This may be done every 1-2 years. ? Talk with your health care provider about how often you should have regular mammograms.  Abdominal aortic aneurysm (AAA) screening. You may need this if you are a current or former smoker.  BRCA-related cancer screening. This may be done if you have a family history of breast, ovarian, tubal, or peritoneal cancers. Other tests  STD (sexually transmitted disease) testing, if you are at risk.  Bone density scan. This is done to screen for osteoporosis. You  may have this done starting at age 7. Talk with your health care provider about your test results, treatment options, and if necessary, the need for more tests. Follow these instructions at home: Eating and drinking  Eat a diet that includes fresh fruits and vegetables, whole grains, lean protein, and low-fat dairy products. Limit your intake of foods with high amounts of sugar, saturated fats, and salt.  Take vitamin and mineral supplements as recommended by your health care provider.  Do not drink alcohol if your health care provider tells you not to drink.  If you drink alcohol: ? Limit how much you have to 0-1 drink a day. ? Be aware of how much alcohol is in your drink. In the [place] one drink equals one 12 oz bottle of beer (355 mL), one 5 oz glass of wine (148 mL), or one 1 oz glass of hard liquor (44 mL).   Lifestyle  Take daily care of your teeth and gums. Brush your teeth every morning and night with fluoride toothpaste. Floss one time each day.  Stay active. Exercise for at least 30 minutes 5 or more days each week.  Do not use any products that contain nicotine or tobacco, such as cigarettes, e-cigarettes, and chewing tobacco. If you need help quitting, ask your health care provider.  Do not use drugs.  If you are sexually active, practice safe sex. Use a condom or other form of protection in order to prevent STIs (sexually transmitted infections).  Talk with your health care provider about taking a low-dose aspirin or statin.  Find healthy ways to cope with stress, such as: ? Meditation, yoga, or listening to music. ? Journaling. ? Talking to a trusted person. ? Spending time with friends and family. Safety  Always wear your seat belt while driving or riding in a vehicle.  Do not drive: ? If you have been drinking alcohol. Do not ride with someone who has been drinking. ? When you are tired or distracted. ? While texting.  Wear a helmet and other protective  equipment during sports  activities.  If you have firearms in your house, make sure you follow all gun safety procedures. What's next?  Visit your health care provider once a year for an annual wellness visit.  Ask your health care provider how often you should have your eyes and teeth checked.  Stay up to date on all vaccines. This information is not intended to replace advice given to you by your health care provider. Make sure you discuss any questions you have with your health care provider. Document Revised: 03/27/2020 Document Reviewed: 03/31/2018 Elsevier Patient Education  2021 [place], MD [facility]

## 2020-08-02 LAB — LIPID PANEL
Cholesterol: 231 mg/dL — ABNORMAL HIGH (ref <200)
HDL: 89 mg/dL (ref >=50)
LDL Cholesterol (Calc): 128 mg/dL (calc) — ABNORMAL HIGH
Non-HDL Cholesterol (Calc): 142 mg/dL (calc) — ABNORMAL HIGH (ref <130)
Total CHOL/HDL Ratio: 2.6 (calc) (ref <5.0)
Triglycerides: 50 mg/dL (ref <150)

## 2020-08-02 LAB — COMPREHENSIVE METABOLIC PANEL
AG Ratio: 1.5 (calc) (ref 1.0–2.5)
ALT: 11 U/L (ref 6–29)
AST: 18 U/L (ref 10–35)
Albumin: 4.2 g/dL (ref 3.6–5.1)
Alkaline phosphatase (APISO): 60 U/L (ref 37–153)
BUN: 21 mg/dL (ref 7–25)
CO2: 27 mmol/L (ref 20–32)
Calcium: 9.8 mg/dL (ref 8.6–10.4)
Chloride: 102 mmol/L (ref 98–110)
Creat: 0.78 mg/dL (ref 0.60–0.93)
Globulin: 2.8 g/dL (calc) (ref 1.9–3.7)
Glucose, Bld: 92 mg/dL (ref 65–99)
Potassium: 3.5 mmol/L (ref 3.5–5.3)
Sodium: 143 mmol/L (ref 135–146)
Total Bilirubin: 0.7 mg/dL (ref 0.2–1.2)
Total Protein: 7 g/dL (ref 6.1–8.1)

## 2020-08-02 LAB — CBC WITH DIFFERENTIAL/PLATELET
Absolute Monocytes: 301 cells/uL (ref 200–950)
Basophils Absolute: 22 cells/uL (ref 0–200)
Basophils Relative: 0.5 %
Eosinophils Absolute: 52 cells/uL (ref 15–500)
Eosinophils Relative: 1.2 %
HCT: 40 % (ref 35.0–45.0)
Hemoglobin: 13.5 g/dL (ref 11.7–15.5)
Lymphs Abs: 1157 cells/uL (ref 850–3900)
MCH: 30 pg (ref 27.0–33.0)
MCHC: 33.8 g/dL (ref 32.0–36.0)
MCV: 88.9 fL (ref 80.0–100.0)
MPV: 9.8 fL (ref 7.5–12.5)
Monocytes Relative: 7 %
Neutro Abs: 2769 cells/uL (ref 1500–7800)
Neutrophils Relative %: 64.4 %
Platelets: 257 10*3/uL (ref 140–400)
RBC: 4.5 10*6/uL (ref 3.80–5.10)
RDW: 13.6 % (ref 11.0–15.0)
Total Lymphocyte: 26.9 %
WBC: 4.3 10*3/uL (ref 3.8–10.8)

## 2020-08-02 LAB — TSH: TSH: 4.47 mIU/L (ref 0.40–4.50)

## 2020-08-05 ENCOUNTER — Telehealth: Payer: Self-pay | Admitting: Internal Medicine

## 2020-08-05 NOTE — Telephone Encounter (Signed)
Pt call about her refill on  levothyroxine sent to [facility]    [id number] - [place], [place] - [address] Phone:  [phone]  Fax:  [phone]

## 2020-08-06 MED ORDER — LEVOTHYROXINE SODIUM 75 MCG PO TABS: 75.0000 ug | ORAL_TABLET | Freq: Every day | ORAL | 1 refills | Status: DC

## 2020-08-06 NOTE — Telephone Encounter (Signed)
Refill sent.

## 2020-08-07 ENCOUNTER — Other Ambulatory Visit: Payer: Self-pay | Admitting: Internal Medicine

## 2020-08-07 DIAGNOSIS — E785 Hyperlipidemia, unspecified: Secondary | ICD-10-CM

## 2020-08-08 ENCOUNTER — Telehealth: Payer: Self-pay | Admitting: Internal Medicine

## 2020-08-08 MED ORDER — HYDROCHLOROTHIAZIDE 12.5 MG PO CAPS: ORAL_CAPSULE | ORAL | 1 refills | Status: DC

## 2020-08-08 NOTE — Telephone Encounter (Signed)
Pt call and stated she need a refill on  hydrochlorothiazide (MICROZIDE) 12.5 MG capsule sent to  [id number] - [place], [place] - [address] Phone:  [phone]  Fax:  [phone]

## 2021-01-20 ENCOUNTER — Other Ambulatory Visit: Payer: Self-pay | Admitting: Internal Medicine

## 2021-01-24 DIAGNOSIS — H524 Presbyopia: Secondary | ICD-10-CM | POA: Diagnosis not present

## 2021-01-24 DIAGNOSIS — H5202 Hypermetropia, left eye: Secondary | ICD-10-CM | POA: Diagnosis not present

## 2021-01-24 DIAGNOSIS — H353132 Nonexudative age-related macular degeneration, bilateral, intermediate dry stage: Secondary | ICD-10-CM | POA: Diagnosis not present

## 2021-01-24 DIAGNOSIS — H52223 Regular astigmatism, bilateral: Secondary | ICD-10-CM | POA: Diagnosis not present

## 2021-01-24 DIAGNOSIS — H43393 Other vitreous opacities, bilateral: Secondary | ICD-10-CM | POA: Diagnosis not present

## 2021-01-24 DIAGNOSIS — H3530 Unspecified macular degeneration: Secondary | ICD-10-CM | POA: Diagnosis not present

## 2021-01-24 DIAGNOSIS — H2513 Age-related nuclear cataract, bilateral: Secondary | ICD-10-CM | POA: Diagnosis not present

## 2021-01-24 DIAGNOSIS — H5211 Myopia, right eye: Secondary | ICD-10-CM | POA: Diagnosis not present

## 2021-02-12 DIAGNOSIS — R600 Localized edema: Secondary | ICD-10-CM | POA: Diagnosis not present

## 2021-02-12 DIAGNOSIS — I87323 Chronic venous hypertension (idiopathic) with inflammation of bilateral lower extremity: Secondary | ICD-10-CM | POA: Diagnosis not present

## 2021-02-20 DIAGNOSIS — L820 Inflamed seborrheic keratosis: Secondary | ICD-10-CM | POA: Diagnosis not present

## 2021-02-20 DIAGNOSIS — L821 Other seborrheic keratosis: Secondary | ICD-10-CM | POA: Diagnosis not present

## 2021-03-11 DIAGNOSIS — I8311 Varicose veins of right lower extremity with inflammation: Secondary | ICD-10-CM | POA: Diagnosis not present

## 2021-03-11 DIAGNOSIS — I8312 Varicose veins of left lower extremity with inflammation: Secondary | ICD-10-CM | POA: Diagnosis not present

## 2021-03-26 DIAGNOSIS — I87323 Chronic venous hypertension (idiopathic) with inflammation of bilateral lower extremity: Secondary | ICD-10-CM | POA: Diagnosis not present

## 2021-05-07 DIAGNOSIS — H5202 Hypermetropia, left eye: Secondary | ICD-10-CM | POA: Diagnosis not present

## 2021-05-07 DIAGNOSIS — H524 Presbyopia: Secondary | ICD-10-CM | POA: Diagnosis not present

## 2021-05-07 DIAGNOSIS — H353132 Nonexudative age-related macular degeneration, bilateral, intermediate dry stage: Secondary | ICD-10-CM | POA: Diagnosis not present

## 2021-05-07 DIAGNOSIS — H52223 Regular astigmatism, bilateral: Secondary | ICD-10-CM | POA: Diagnosis not present

## 2021-05-07 DIAGNOSIS — H3530 Unspecified macular degeneration: Secondary | ICD-10-CM | POA: Diagnosis not present

## 2021-05-07 DIAGNOSIS — H5211 Myopia, right eye: Secondary | ICD-10-CM | POA: Diagnosis not present

## 2021-06-03 DIAGNOSIS — D2262 Melanocytic nevi of left upper limb, including shoulder: Secondary | ICD-10-CM | POA: Diagnosis not present

## 2021-06-03 DIAGNOSIS — D225 Melanocytic nevi of trunk: Secondary | ICD-10-CM | POA: Diagnosis not present

## 2021-06-03 DIAGNOSIS — L814 Other melanin hyperpigmentation: Secondary | ICD-10-CM | POA: Diagnosis not present

## 2021-06-03 DIAGNOSIS — L821 Other seborrheic keratosis: Secondary | ICD-10-CM | POA: Diagnosis not present

## 2021-06-03 DIAGNOSIS — D2261 Melanocytic nevi of right upper limb, including shoulder: Secondary | ICD-10-CM | POA: Diagnosis not present

## 2021-06-03 DIAGNOSIS — L817 Pigmented purpuric dermatosis: Secondary | ICD-10-CM | POA: Diagnosis not present

## 2021-07-08 DIAGNOSIS — H18413 Arcus senilis, bilateral: Secondary | ICD-10-CM | POA: Diagnosis not present

## 2021-07-08 DIAGNOSIS — H353131 Nonexudative age-related macular degeneration, bilateral, early dry stage: Secondary | ICD-10-CM | POA: Diagnosis not present

## 2021-07-08 DIAGNOSIS — H2513 Age-related nuclear cataract, bilateral: Secondary | ICD-10-CM | POA: Diagnosis not present

## 2021-07-08 DIAGNOSIS — H2511 Age-related nuclear cataract, right eye: Secondary | ICD-10-CM | POA: Diagnosis not present

## 2021-07-08 DIAGNOSIS — H25043 Posterior subcapsular polar age-related cataract, bilateral: Secondary | ICD-10-CM | POA: Diagnosis not present

## 2021-07-16 ENCOUNTER — Other Ambulatory Visit: Payer: Self-pay | Admitting: Internal Medicine

## 2021-09-03 DIAGNOSIS — H2513 Age-related nuclear cataract, bilateral: Secondary | ICD-10-CM | POA: Diagnosis not present

## 2021-09-03 DIAGNOSIS — H353131 Nonexudative age-related macular degeneration, bilateral, early dry stage: Secondary | ICD-10-CM | POA: Diagnosis not present

## 2021-09-03 DIAGNOSIS — H18413 Arcus senilis, bilateral: Secondary | ICD-10-CM | POA: Diagnosis not present

## 2021-09-03 DIAGNOSIS — H2511 Age-related nuclear cataract, right eye: Secondary | ICD-10-CM | POA: Diagnosis not present

## 2021-09-03 DIAGNOSIS — H25043 Posterior subcapsular polar age-related cataract, bilateral: Secondary | ICD-10-CM | POA: Diagnosis not present

## 2021-09-03 DIAGNOSIS — H25013 Cortical age-related cataract, bilateral: Secondary | ICD-10-CM | POA: Diagnosis not present

## 2021-10-07 DIAGNOSIS — H269 Unspecified cataract: Secondary | ICD-10-CM | POA: Diagnosis not present

## 2021-10-07 DIAGNOSIS — H2512 Age-related nuclear cataract, left eye: Secondary | ICD-10-CM | POA: Diagnosis not present

## 2021-10-07 DIAGNOSIS — H2511 Age-related nuclear cataract, right eye: Secondary | ICD-10-CM | POA: Diagnosis not present

## 2021-10-07 DIAGNOSIS — H25012 Cortical age-related cataract, left eye: Secondary | ICD-10-CM | POA: Diagnosis not present

## 2021-10-07 DIAGNOSIS — H25042 Posterior subcapsular polar age-related cataract, left eye: Secondary | ICD-10-CM | POA: Diagnosis not present

## 2021-10-10 ENCOUNTER — Other Ambulatory Visit: Payer: Self-pay | Admitting: Internal Medicine

## 2021-10-13 ENCOUNTER — Telehealth: Payer: Self-pay | Admitting: Internal Medicine

## 2021-10-13 MED ORDER — HYDROCHLOROTHIAZIDE 12.5 MG PO CAPS: 12.5000 mg | ORAL_CAPSULE | Freq: Every day | ORAL | 0 refills | Status: DC

## 2021-10-13 NOTE — Telephone Encounter (Signed)
Pt requesting refill of hydrochlorothiazide (MICROZIDE) 12.5 MG capsule   is scheduled for OV 11/06/2021 and as of today she has 5 tablets left.

## 2021-10-14 DIAGNOSIS — H269 Unspecified cataract: Secondary | ICD-10-CM | POA: Diagnosis not present

## 2021-10-14 DIAGNOSIS — H2512 Age-related nuclear cataract, left eye: Secondary | ICD-10-CM | POA: Diagnosis not present

## 2021-11-06 ENCOUNTER — Other Ambulatory Visit: Payer: Self-pay | Admitting: Internal Medicine

## 2021-11-06 ENCOUNTER — Encounter: Payer: Self-pay | Admitting: Internal Medicine

## 2021-11-06 ENCOUNTER — Ambulatory Visit (INDEPENDENT_AMBULATORY_CARE_PROVIDER_SITE_OTHER): Payer: Medicare Other | Admitting: Internal Medicine

## 2021-11-06 VITALS — BP 110/80 | HR 65 | Temp 97.9°F | Ht 61.5 in | Wt 126.4 lb

## 2021-11-06 DIAGNOSIS — M810 Age-related osteoporosis without current pathological fracture: Secondary | ICD-10-CM | POA: Diagnosis not present

## 2021-11-06 DIAGNOSIS — E782 Mixed hyperlipidemia: Secondary | ICD-10-CM

## 2021-11-06 DIAGNOSIS — Z1239 Encounter for other screening for malignant neoplasm of breast: Secondary | ICD-10-CM | POA: Diagnosis not present

## 2021-11-06 DIAGNOSIS — Z171 Estrogen receptor negative status [ER-]: Secondary | ICD-10-CM

## 2021-11-06 DIAGNOSIS — Z0000 Encounter for general adult medical examination without abnormal findings: Secondary | ICD-10-CM | POA: Diagnosis not present

## 2021-11-06 DIAGNOSIS — E039 Hypothyroidism, unspecified: Secondary | ICD-10-CM

## 2021-11-06 DIAGNOSIS — Z780 Asymptomatic menopausal state: Secondary | ICD-10-CM | POA: Diagnosis not present

## 2021-11-06 DIAGNOSIS — E559 Vitamin D deficiency, unspecified: Secondary | ICD-10-CM | POA: Diagnosis not present

## 2021-11-06 DIAGNOSIS — E785 Hyperlipidemia, unspecified: Secondary | ICD-10-CM | POA: Insufficient documentation

## 2021-11-06 DIAGNOSIS — M8580 Other specified disorders of bone density and structure, unspecified site: Secondary | ICD-10-CM

## 2021-11-06 DIAGNOSIS — I10 Essential (primary) hypertension: Secondary | ICD-10-CM | POA: Diagnosis not present

## 2021-11-06 DIAGNOSIS — C50921 Malignant neoplasm of unspecified site of right male breast: Secondary | ICD-10-CM

## 2021-11-06 DIAGNOSIS — Z1231 Encounter for screening mammogram for malignant neoplasm of breast: Secondary | ICD-10-CM | POA: Diagnosis not present

## 2021-11-06 DIAGNOSIS — Z9886 Personal history of breast implant removal: Secondary | ICD-10-CM

## 2021-11-06 DIAGNOSIS — Z13820 Encounter for screening for osteoporosis: Secondary | ICD-10-CM | POA: Diagnosis not present

## 2021-11-06 LAB — LIPID PANEL
Cholesterol: 273 mg/dL — ABNORMAL HIGH (ref 0–200)
HDL: 67.4 mg/dL (ref >39.00)
LDL Cholesterol: 186 mg/dL — ABNORMAL HIGH (ref 0–99)
NonHDL: 205.92
Total CHOL/HDL Ratio: 4
Triglycerides: 99 mg/dL (ref 0.0–149.0)
VLDL: 19.8 mg/dL (ref 0.0–40.0)

## 2021-11-06 LAB — CBC WITH DIFFERENTIAL/PLATELET
Basophils Absolute: 0 10*3/uL (ref 0.0–0.1)
Basophils Relative: 0.6 % (ref 0.0–3.0)
Eosinophils Absolute: 0.1 10*3/uL (ref 0.0–0.7)
Eosinophils Relative: 1.4 % (ref 0.0–5.0)
HCT: 39.1 % (ref 36.0–46.0)
Hemoglobin: 13.1 g/dL (ref 12.0–15.0)
Lymphocytes Relative: 26.3 % (ref 12.0–46.0)
Lymphs Abs: 1.1 10*3/uL (ref 0.7–4.0)
MCHC: 33.6 g/dL (ref 30.0–36.0)
MCV: 87.8 fl (ref 78.0–100.0)
Monocytes Absolute: 0.4 10*3/uL (ref 0.1–1.0)
Monocytes Relative: 8.5 % (ref 3.0–12.0)
Neutro Abs: 2.7 10*3/uL (ref 1.4–7.7)
Neutrophils Relative %: 63.2 % (ref 43.0–77.0)
Platelets: 282 10*3/uL (ref 150.0–400.0)
RBC: 4.45 Mil/uL (ref 3.87–5.11)
RDW: 14.1 % (ref 11.5–15.5)
WBC: 4.3 10*3/uL (ref 4.0–10.5)

## 2021-11-06 LAB — COMPREHENSIVE METABOLIC PANEL
ALT: 14 U/L (ref 0–35)
AST: 21 U/L (ref 0–37)
Albumin: 4.4 g/dL (ref 3.5–5.2)
Alkaline Phosphatase: 62 U/L (ref 39–117)
BUN: 12 mg/dL (ref 6–23)
CO2: 32 mEq/L (ref 19–32)
Calcium: 8.9 mg/dL (ref 8.4–10.5)
Chloride: 99 mEq/L (ref 96–112)
Creatinine, Ser: 0.69 mg/dL (ref 0.40–1.20)
GFR: 82.97 mL/min (ref >60.00)
Glucose, Bld: 85 mg/dL (ref 70–99)
Potassium: 3.4 mEq/L — ABNORMAL LOW (ref 3.5–5.1)
Sodium: 139 mEq/L (ref 135–145)
Total Bilirubin: 0.5 mg/dL (ref 0.2–1.2)
Total Protein: 7.2 g/dL (ref 6.0–8.3)

## 2021-11-06 LAB — VITAMIN D 25 HYDROXY (VIT D DEFICIENCY, FRACTURES): VITD: 31.95 ng/mL (ref 30.00–100.00)

## 2021-11-06 LAB — TSH: TSH: 0.36 u[IU]/mL (ref 0.35–5.50)

## 2021-11-06 LAB — VITAMIN B12: Vitamin B-12: 1496 pg/mL — ABNORMAL HIGH (ref 211–911)

## 2021-11-06 MED ORDER — HYDROCHLOROTHIAZIDE 12.5 MG PO CAPS: 12.5000 mg | ORAL_CAPSULE | Freq: Every day | ORAL | 1 refills | Status: DC

## 2021-11-06 MED ORDER — LEVOTHYROXINE SODIUM 75 MCG PO TABS: ORAL_TABLET | ORAL | 1 refills | Status: DC

## 2021-11-06 MED ORDER — ATORVASTATIN CALCIUM 20 MG PO TABS: 20.0000 mg | ORAL_TABLET | Freq: Every day | ORAL | 1 refills | Status: DC

## 2021-11-06 MED ORDER — IBANDRONATE SODIUM 150 MG PO TABS: 150.0000 mg | ORAL_TABLET | ORAL | 1 refills | Status: DC

## 2021-11-06 NOTE — Patient Instructions (Signed)
-  Nice seeing you today!!  -Lab work today; will notify you once results are available.  -Schedule follow up in 1 year or sooner as needed. 

## 2021-11-06 NOTE — Progress Notes (Signed)
Established Patient Office Visit     CC/Reason for Visit: Annual preventive exam and subsequent Medicare wellness visit  HPI: Tiffany Reed is a 81 y.o. female who is coming in today for the above mentioned reasons. Past Medical History is significant for: Hypertension, hypothyroidism, history of breast cancer in 2005.  She is doing well and has no acute concerns or complaints.  She just had both cataracts repaired in late June.  She has routine eye and dental care.  She has noticed some decreased hearing.  We discussed audiology referral, she has a friend that works with an Nurse, children's and will provide name for referral.  All immunizations are up-to-date.  She is overdue for mammogram.  She would be due for her 10-year colonoscopy this year, however she will discuss with her husband whether she would like to continue this given her age.  She continues Pap smears with her GYN.   Past Medical/Surgical History: Past Medical History:  Diagnosis Date   Breast cancer ([place]) 2005   right, s/p xrt   Dysrhythmia    had halter monitor 4/13-all ok for palpatations   Fluttering heart    and racing over the past several years with lightheadedness   H/O urinary incontinence    Hypertension    Hypothyroidism    Osteopenia    Personal history of radiation therapy     Past Surgical History:  Procedure Laterality Date   BREAST LUMPECTOMY  04/21/2003   right   EAR CYST EXCISION Left 06/15/2012   Procedure: Left scaphoid cyst excision with distal radius graft;  Surgeon: Wynonia Sours, MD;  Location: [facility];  Service: Orthopedics;  Laterality: Left;  EXCISION CYST LEFT SCAPHOID DISTAL RADIUS GRAFT   EXCISION MORTON'S NEUROMA  04/21/1999   left foot   EYE SURGERY     GANGLION CYST EXCISION  04/21/2003   left thumb   HAMMER TOE SURGERY  04/20/2010   right 5th toe   HEMORROIDECTOMY  04/21/1971   HYSTEROSCOPY  2005,2002   NEUROPLASTY / TRANSPOSITION MEDIAN NERVE AT CARPAL  TUNNEL BILATERAL  04/20/2000   PILONIDAL CYST / SINUS EXCISION  04/20/1966   plantar fibroma     4th toe, right foot   THYROID LOBECTOMY  04/20/2002    Social History:  reports that she has never smoked. She has never used smokeless tobacco. She reports current alcohol use. She reports that she does not use drugs.  Allergies: Allergies  Allergen Reactions   Sorbitol     Gas, diarrhea   Penicillins Rash    Family History:  Family History  Problem Relation Age of Onset   Colon cancer Neg Hx    Stomach cancer Neg Hx      Current Outpatient Medications:    hydrochlorothiazide (MICROZIDE) 12.5 MG capsule, Take 1 capsule (12.5 mg total) by mouth daily., Disp: 30 capsule, Rfl: 0   ibandronate (BONIVA) 150 MG tablet, Take 150 mg by mouth every 30 (thirty) days. Take in the morning with a full glass of water, on an empty stomach, and do not take anything else by mouth or lie down for the next 30 min., Disp: , Rfl:    levothyroxine (SYNTHROID) 75 MCG tablet, TAKE 1 TABLET BY MOUTH EVERY DAY BEFORE BREAKFAST, Disp: 90 tablet, Rfl: 0  Review of Systems:  Constitutional: Denies fever, chills, diaphoresis, appetite change and fatigue.  HEENT: Denies photophobia, eye pain, redness, hearing loss, ear pain, congestion, sore throat, rhinorrhea, sneezing, mouth  sores, trouble swallowing, neck pain, neck stiffness and tinnitus.   Respiratory: Denies SOB, DOE, cough, chest tightness,  and wheezing.   Cardiovascular: Denies chest pain, palpitations and leg swelling.  Gastrointestinal: Denies nausea, vomiting, abdominal pain, diarrhea, constipation, blood in stool and abdominal distention.  Genitourinary: Denies dysuria, urgency, frequency, hematuria, flank pain and difficulty urinating.  Endocrine: Denies: hot or cold intolerance, sweats, changes in hair or nails, polyuria, polydipsia. Musculoskeletal: Denies myalgias, back pain, joint swelling, arthralgias and gait problem.  Skin: Denies pallor,  rash and wound.  Neurological: Denies dizziness, seizures, syncope, weakness, light-headedness, numbness and headaches.  Hematological: Denies adenopathy. Easy bruising, personal or family bleeding history  Psychiatric/Behavioral: Denies suicidal ideation, mood changes, confusion, nervousness, sleep disturbance and agitation    Physical Exam: Vitals:   11/06/21 1353  BP: 110/80  Pulse: 65  Temp: 97.9 F (36.6 C)  TempSrc: Oral  SpO2: 98%  Weight: 126 lb 6.4 oz (57.3 kg)  Height: 5' 1.5" (1.562 m)    Body mass index is 23.5 kg/m.   Constitutional: NAD, calm, comfortable Eyes: PERRL, lids and conjunctivae normal ENMT: Mucous membranes are moist. Posterior pharynx clear of any exudate or lesions. Normal dentition. Tympanic membrane is pearly white, no erythema or bulging. Neck: normal, supple, no masses, no thyromegaly Respiratory: clear to auscultation bilaterally, no wheezing, no crackles. Normal respiratory effort. No accessory muscle use.  Cardiovascular: Regular rate and rhythm, no murmurs / rubs / gallops. No extremity edema. 2+ pedal pulses. No carotid bruits.  Abdomen: no tenderness, no masses palpated. No hepatosplenomegaly. Bowel sounds positive.  Musculoskeletal: no clubbing / cyanosis. No joint deformity upper and lower extremities. Good ROM, no contractures. Normal muscle tone.  Skin: no rashes, lesions, ulcers. No induration Neurologic: CN 2-12 grossly intact. Sensation intact, DTR normal. Strength 5/5 in all 4.  Psychiatric: Normal judgment and insight. Alert and oriented x 3. Normal mood.    Subsequent Medicare wellness visit   1. Risk factors, based on past  M,S,F -cardiovascular disease risk factors include age and history of hypertension   2.  Physical activities: Remains very physically active   3.  Depression/mood: Stable, not depressed   4.  Hearing: Decreased hearing left ear   5.  ADL's: Independent in all ADLs   6.  Fall risk: Low fall risk    7.  Home safety: No problems identified   8.  Height weight, and visual acuity: height and weight as above, vision:  Vision Screening   Right eye Left eye Both eyes  Without correction '20/20 20/20 20/20 '$  With correction        9.  Counseling: We discussed healthy lifestyle in detail   10. Lab orders based on risk factors: Laboratory update will be reviewed   11. Referral : Audiology referral   12. Care plan: Follow-up with me in 6 to 12 months   13. Cognitive assessment: No cognitive impairment   14. Screening: Patient provided with a written and personalized 5-10 year screening schedule in the AVS. yes   15. Provider List Update: PCP, ophthalmologist.  16. Advance Directives: Full code   17. Opioids: Patient is not on any opioid prescriptions and has no risk factors for a substance use disorder.   [place] Office Visit from 11/06/2021 in [facility] at [place]  PHQ-9 Total Score 0          03/10/2018   11:40 AM 05/13/2018    3:49 PM 04/07/2019    3:06 PM 08/01/2020  2:11 PM 11/06/2021    2:05 PM  Fall Risk  Falls in the past year? 0 0 1 1 0  Was there an injury with Fall?  0 1 1 0  Fall Risk Category Calculator  0 2 2 0  Fall Risk Category  Low Moderate Moderate Low  Patient Fall Risk Level     Low fall risk  Patient at Risk for Falls Due to     No Fall Risks  Fall risk Follow up     Falls evaluation completed     Impression and Plan:  Encounter for preventive health examination -Recommend routine eye and dental care. -Immunizations: All immunizations are up-to-date and age-appropriate -Healthy lifestyle discussed in detail. -Labs to be updated today. -Colon cancer screening: 06/2011, she is due for her 10-year follow-up.  She is not sure if she would like to continue forward with this.  She will discuss with husband and report back. -Breast cancer screening: She is overdue, mammogram order placed -Cervical cancer screening: Follows with  GYN -Lung cancer screening: Not applicable -Prostate cancer screening: Not applicable -DEXA: 11/32, requested.  Screening breast examination  Acquired hypothyroidism  Essential hypertension  Malignant neoplasm of right breast in female, estrogen receptor negative, unspecified site of breast ([place])  Osteopenia, unspecified location  Vitamin D deficiency     Patient Instructions  -Nice seeing you today!!  -Lab work today; will notify you once results are available.  -Schedule follow up in 1 year or sooner as needed.      Lelon Frohlich, MD [facility]

## 2021-11-17 ENCOUNTER — Other Ambulatory Visit: Payer: Self-pay

## 2021-11-17 ENCOUNTER — Telehealth: Payer: Self-pay | Admitting: Internal Medicine

## 2021-11-17 DIAGNOSIS — E782 Mixed hyperlipidemia: Secondary | ICD-10-CM

## 2021-11-17 NOTE — Telephone Encounter (Signed)
Patient called returning Nurse's call Melene Muller) Nurse was rooming a Pt and will call Pt back. Pt stated she'd like a call back to this number instead:  [phone]

## 2021-11-17 NOTE — Telephone Encounter (Signed)
Pt is aware Garnette Czech is with a patient

## 2021-11-17 NOTE — Telephone Encounter (Signed)
See result note.  

## 2021-11-18 ENCOUNTER — Other Ambulatory Visit: Payer: Medicare Other

## 2021-12-02 ENCOUNTER — Ambulatory Visit
Admission: RE | Admit: 2021-12-02 | Discharge: 2021-12-02 | Disposition: A | Discharge status: Home / Self Care | Payer: Medicare Other | Source: Ambulatory Visit | Attending: Internal Medicine | Admitting: Internal Medicine

## 2021-12-02 DIAGNOSIS — Z1231 Encounter for screening mammogram for malignant neoplasm of breast: Secondary | ICD-10-CM

## 2021-12-02 DIAGNOSIS — Z1239 Encounter for other screening for malignant neoplasm of breast: Secondary | ICD-10-CM

## 2022-01-05 ENCOUNTER — Ambulatory Visit (INDEPENDENT_AMBULATORY_CARE_PROVIDER_SITE_OTHER): Payer: Medicare Other

## 2022-01-05 ENCOUNTER — Ambulatory Visit (INDEPENDENT_AMBULATORY_CARE_PROVIDER_SITE_OTHER): Payer: Medicare Other | Admitting: Podiatry

## 2022-01-05 ENCOUNTER — Encounter: Payer: Self-pay | Admitting: Podiatry

## 2022-01-05 DIAGNOSIS — M79672 Pain in left foot: Secondary | ICD-10-CM

## 2022-01-05 DIAGNOSIS — M6740 Ganglion, unspecified site: Secondary | ICD-10-CM | POA: Diagnosis not present

## 2022-01-05 DIAGNOSIS — Q828 Other specified congenital malformations of skin: Secondary | ICD-10-CM | POA: Diagnosis not present

## 2022-01-07 NOTE — Progress Notes (Signed)
Subjective:   Patient ID: Tiffany Reed, female   DOB: 81 y.o.   MRN: [id number]   HPI Patient presents with lesions bottom of the left foot that she is concerned are wart or possible ganglion on.  One of them she can feel through the skin and the other one feels under the skin and is bothersome.  Patient does not remember specific injury associated with this and states at times it is hard to walk on.  Patient does not smoke likes to be active   Review of Systems  All other systems reviewed and are negative.       Objective:  Physical Exam Vitals and nursing note reviewed.  Constitutional:      Appearance: She is well-developed.  Pulmonary:     Effort: Pulmonary effort is normal.  Musculoskeletal:        General: Normal range of motion.  Skin:    General: Skin is warm.  Neurological:     Mental Status: She is alert.     Neurovascular status found to be intact muscle strength was found to be adequate range of motion adequate.  Patient is noted to have 2 separate areas plantar aspect left one is a small lucent like cord lesion tender and the other is some swelling of the soft tissue plantarly distal to the second metatarsal.  Patient has good digital perfusion well-oriented x3 probability for     Assessment:  Oral keratosis of the plantar left foot along with possibility for some form of cyst ganglionic sebaceous plantar left H&P     Plan:  We reviewed both conditions discussed courtesy debridement accomplished advised on padding soaks and that we may need to drain the 1 area but we will get a hold off currently.  Reappoint as needed  X-ray was negative for signs that there is any kind of bony issue with this appears to be soft tissue

## 2022-02-12 ENCOUNTER — Other Ambulatory Visit: Payer: Medicare Other

## 2022-03-03 ENCOUNTER — Other Ambulatory Visit: Payer: Medicare Other

## 2022-03-09 DIAGNOSIS — Z23 Encounter for immunization: Secondary | ICD-10-CM | POA: Diagnosis not present

## 2022-03-19 ENCOUNTER — Other Ambulatory Visit: Payer: Medicare Other

## 2022-04-09 ENCOUNTER — Other Ambulatory Visit: Payer: Medicare Other

## 2022-04-10 ENCOUNTER — Other Ambulatory Visit (INDEPENDENT_AMBULATORY_CARE_PROVIDER_SITE_OTHER): Payer: Medicare Other

## 2022-04-10 DIAGNOSIS — E782 Mixed hyperlipidemia: Secondary | ICD-10-CM

## 2022-04-10 LAB — LIPID PANEL
Cholesterol: 267 mg/dL — ABNORMAL HIGH (ref 0–200)
HDL: 90.3 mg/dL (ref >39.00)
LDL Cholesterol: 168 mg/dL — ABNORMAL HIGH (ref 0–99)
NonHDL: 176.29
Total CHOL/HDL Ratio: 3
Triglycerides: 42 mg/dL (ref 0.0–149.0)
VLDL: 8.4 mg/dL (ref 0.0–40.0)

## 2022-04-28 ENCOUNTER — Other Ambulatory Visit: Payer: Self-pay | Admitting: Internal Medicine

## 2022-04-28 DIAGNOSIS — E039 Hypothyroidism, unspecified: Secondary | ICD-10-CM

## 2022-04-30 ENCOUNTER — Encounter: Payer: Self-pay | Admitting: Internal Medicine

## 2022-05-01 ENCOUNTER — Ambulatory Visit
Admission: RE | Admit: 2022-05-01 | Discharge: 2022-05-01 | Disposition: A | Discharge status: Home / Self Care | Payer: Medicare Other | Source: Ambulatory Visit | Attending: Internal Medicine | Admitting: Internal Medicine

## 2022-05-01 ENCOUNTER — Other Ambulatory Visit: Payer: Self-pay | Admitting: Internal Medicine

## 2022-05-01 DIAGNOSIS — Z13820 Encounter for screening for osteoporosis: Secondary | ICD-10-CM

## 2022-05-01 DIAGNOSIS — M810 Age-related osteoporosis without current pathological fracture: Secondary | ICD-10-CM

## 2022-05-01 DIAGNOSIS — I10 Essential (primary) hypertension: Secondary | ICD-10-CM

## 2022-05-01 DIAGNOSIS — Z780 Asymptomatic menopausal state: Secondary | ICD-10-CM | POA: Diagnosis not present

## 2022-05-01 DIAGNOSIS — M8588 Other specified disorders of bone density and structure, other site: Secondary | ICD-10-CM | POA: Diagnosis not present

## 2022-05-04 ENCOUNTER — Encounter: Payer: Self-pay | Admitting: Internal Medicine

## 2022-05-14 ENCOUNTER — Ambulatory Visit: Payer: Medicare Other | Admitting: Internal Medicine

## 2022-05-19 DIAGNOSIS — Z6823 Body mass index (BMI) 23.0-23.9, adult: Secondary | ICD-10-CM | POA: Diagnosis not present

## 2022-05-19 DIAGNOSIS — Z01419 Encounter for gynecological examination (general) (routine) without abnormal findings: Secondary | ICD-10-CM | POA: Diagnosis not present

## 2022-05-20 ENCOUNTER — Encounter: Payer: Self-pay | Admitting: Internal Medicine

## 2022-05-20 ENCOUNTER — Ambulatory Visit (INDEPENDENT_AMBULATORY_CARE_PROVIDER_SITE_OTHER): Payer: Medicare Other | Admitting: Internal Medicine

## 2022-05-20 VITALS — BP 130/70 | HR 62 | Temp 97.8°F | Wt 125.1 lb

## 2022-05-20 DIAGNOSIS — E782 Mixed hyperlipidemia: Secondary | ICD-10-CM

## 2022-05-20 NOTE — Progress Notes (Signed)
Established Patient Office Visit     CC/Reason for Visit: Discuss cholesterol  HPI: Tiffany Reed is a 82 y.o. female who is coming in today for the above mentioned reasons.  She is here today to discuss hyperlipidemia management.  In July 2023 she was noted to have a total cholesterol of 273 with LDL of 186, HDL of 67 and triglycerides of 99.  Despite my recommendation to starting statins, she declined and instead worked on lifestyle changes.  She returned in December for repeat lipid panel that showed some improvement now with a total cholesterol of 267 and an LDL of 168.  She is quite hesitant to start statins.  She tells me that she had a visit with her OB/GYN who has ordered what sounds like carotid Dopplers.   Past Medical/Surgical History: Past Medical History:  Diagnosis Date   Breast cancer ([facility]) 2005   right, s/p xrt   Dysrhythmia    had halter monitor 4/13-all ok for palpatations   Fluttering heart    and racing over the past several years with lightheadedness   H/O urinary incontinence    Hypertension    Hypothyroidism    Osteopenia    Personal history of radiation therapy     Past Surgical History:  Procedure Laterality Date   BREAST LUMPECTOMY  04/21/2003   right   EAR CYST EXCISION Left 06/15/2012   Procedure: Left scaphoid cyst excision with distal radius graft;  Surgeon: Wynonia Sours, MD;  Location: [facility];  Service: Orthopedics;  Laterality: Left;  EXCISION CYST LEFT SCAPHOID DISTAL RADIUS GRAFT   EXCISION MORTON'S NEUROMA  04/21/1999   left foot   EYE SURGERY     GANGLION CYST EXCISION  04/21/2003   left thumb   HAMMER TOE SURGERY  04/20/2010   right 5th toe   HEMORROIDECTOMY  04/21/1971   HYSTEROSCOPY  2005,2002   NEUROPLASTY / TRANSPOSITION MEDIAN NERVE AT CARPAL TUNNEL BILATERAL  04/20/2000   PILONIDAL CYST / SINUS EXCISION  04/20/1966   plantar fibroma     4th toe, right foot   THYROID LOBECTOMY  04/20/2002    Social  History:  reports that she has never smoked. She has never used smokeless tobacco. She reports current alcohol use. She reports that she does not use drugs.  Allergies: Allergies  Allergen Reactions   Sorbitol     Gas, diarrhea   Penicillins Rash    Family History:  Family History  Problem Relation Age of Onset   Colon cancer Neg Hx    Stomach cancer Neg Hx      Current Outpatient Medications:    hydrochlorothiazide (MICROZIDE) 12.5 MG capsule, TAKE 1 CAPSULE BY MOUTH EVERY DAY, Disp: 90 capsule, Rfl: 1   ibandronate (BONIVA) 150 MG tablet, Take 1 tablet (150 mg total) by mouth every 30 (thirty) days. Take in the morning with a full glass of water, on an empty stomach, and do not take anything else by mouth or lie down for the next 30 min., Disp: 90 tablet, Rfl: 1   levothyroxine (SYNTHROID) 75 MCG tablet, TAKE 1 TABLET BY MOUTH EVERY DAY BEFORE BREAKFAST, Disp: 90 tablet, Rfl: 1   atorvastatin (LIPITOR) 20 MG tablet, Take 1 tablet (20 mg total) by mouth daily. (Patient not taking: Reported on 05/20/2022), Disp: 90 tablet, Rfl: 1  Review of Systems:  Negative unless indicated in HPI.   Physical Exam: Vitals:   05/20/22 1429  BP: 130/70  Pulse:  62  Temp: 97.8 F (36.6 C)  TempSrc: Oral  SpO2: 98%  Weight: 125 lb 1.6 oz (56.7 kg)    Body mass index is 23.25 kg/m.   Physical Exam Vitals reviewed.  Constitutional:      Appearance: Normal appearance.  HENT:     Head: Normocephalic and atraumatic.  Eyes:     Conjunctiva/sclera: Conjunctivae normal.     Pupils: Pupils are equal, round, and reactive to light.  Skin:    General: Skin is warm and dry.  Neurological:     General: No focal deficit present.     Mental Status: She is alert and oriented to person, place, and time.  Psychiatric:        Mood and Affect: Mood normal.        Behavior: Behavior normal.        Thought Content: Thought content normal.        Judgment: Judgment normal.      Impression and  Plan:  Mixed hyperlipidemia - Plan: CT CARDIAC SCORING (SELF PAY ONLY)  -We have had prolonged discussions today about risk-benefit of statin.  She is concerned about risk of cognitive decline.  We have talked about overwhelming benefit in regards to cardiovascular morbidity and mortality.  She is still hesitant to start statins.  We have discussed, and she is agreeable to a coronary CT.  She agrees that if she has a high calcium score she might be less reluctant to start a statin.  Time spent:34 minutes reviewing chart, interviewing and examining patient and formulating plan of care.     Lelon Frohlich, MD [facility]

## 2022-06-16 ENCOUNTER — Encounter: Payer: Self-pay | Admitting: Cardiovascular Disease

## 2022-06-16 NOTE — Progress Notes (Unsigned)
Cardiology Office Note:    Date:  06/17/2022   ID:  Tiffany Reed, DOB Jan 15, 1941, MRN [id number]  PCP:  Isaac Bliss, Rayford Halsted, MD   [facility] Providers Cardiologist:  Shelda Truby  Click to update primary MD,subspecialty MD or APP then REFRESH:1}    Referring MD: Arvella Nigh, MD   Chief Complaint  Patient presents with   Palpitations    History of Present Illness:    Tiffany Reed is a 82 y.o. female with a hx of palpitations, carotid bruit, hypertension, mitral valve prolapse,  hypothyroidism  She brought some screening labs today to the office visit ( [facility] screenng )  Triglyceride level is 100 Total cholesterol is 282 HDL is 115 LDL is 147  Dr. Jerilee Hoh started her on Atorva She has not started yet   Watches her salt but she eats out at least 2 times a week   Does not get any regular exercise  [place]  Does not like to walk the back roads  Has had palpitations,  He HCTZ was reduced in an effort to improve the palpitations      Past Medical History:  Diagnosis Date   Breast cancer [facility]) 2005   right, s/p xrt   Carotid bruit    Dysrhythmia    had halter monitor 4/13-all ok for palpatations   Fluttering heart    and racing over the past several years with lightheadedness   H/O urinary incontinence    History of cardiac arrhythmia    Hypertension    Hypothyroidism    Mitral valve prolapse    Osteopenia    Personal history of radiation therapy     Past Surgical History:  Procedure Laterality Date   BREAST LUMPECTOMY  04/21/2003   right   EAR CYST EXCISION Left 06/15/2012   Procedure: Left scaphoid cyst excision with distal radius graft;  Surgeon: Wynonia Sours, MD;  Location: [facility];  Service: Orthopedics;  Laterality: Left;  EXCISION CYST LEFT SCAPHOID DISTAL RADIUS GRAFT   EXCISION MORTON'S NEUROMA  04/21/1999   left foot   EYE SURGERY     GANGLION CYST EXCISION  04/21/2003   left thumb    HAMMER TOE SURGERY  04/20/2010   right 5th toe   HEMORROIDECTOMY  04/21/1971   HYSTEROSCOPY  2005,2002   NEUROPLASTY / TRANSPOSITION MEDIAN NERVE AT CARPAL TUNNEL BILATERAL  04/20/2000   PILONIDAL CYST / SINUS EXCISION  04/20/1966   plantar fibroma     4th toe, right foot   THYROID LOBECTOMY  04/20/2002    Current Medications: Current Meds  Medication Sig   hydrochlorothiazide (MICROZIDE) 12.5 MG capsule TAKE 1 CAPSULE BY MOUTH EVERY DAY   ibandronate (BONIVA) 150 MG tablet Take 1 tablet (150 mg total) by mouth every 30 (thirty) days. Take in the morning with a full glass of water, on an empty stomach, and do not take anything else by mouth or lie down for the next 30 min.   levothyroxine (SYNTHROID) 75 MCG tablet TAKE 1 TABLET BY MOUTH EVERY DAY BEFORE BREAKFAST   valsartan (DIOVAN) 80 MG tablet Take 1 tablet (80 mg total) by mouth daily.     Allergies:   Sorbitol and Penicillins   Social History   Socioeconomic History   Marital status: Married    Spouse name: Not on file   Number of children: Not on file   Years of education: Not on file   Highest education level:  Bachelor's degree (e.g., BA, AB, BS)  Occupational History   Not on file  Tobacco Use   Smoking status: Never   Smokeless tobacco: Never  Substance and Sexual Activity   Alcohol use: Yes    Comment: rare   Drug use: No   Sexual activity: Not on file  Other Topics Concern   Not on file  Social History Narrative   Pt lives in [place].   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         Social Determinants of Health   Financial Resource Strain: Low Risk  (05/18/2022)   Overall Financial Resource Strain (CARDIA)    Difficulty of Paying Living Expenses: Not hard at all  Food Insecurity: No Food Insecurity (05/18/2022)   Hunger Vital Sign    Worried About Running Out of Food in the Last Year: Never true    Ran Out of Food in the Last Year: Never true  Transportation Needs: No  Transportation Needs (05/18/2022)   PRAPARE - Hydrologist (Medical): No    Lack of Transportation (Non-Medical): No  Physical Activity: Unknown (05/18/2022)   Exercise Vital Sign    Days of Exercise per Week: 0 days    Minutes of Exercise per Session: Not on file  Stress: No Stress Concern Present (05/18/2022)   [place]    Feeling of Stress : Not at all  Social Connections: Moderately Integrated (05/18/2022)   Social Connection and Isolation Panel [NHANES]    Frequency of Communication with Friends and Family: Once a week    Frequency of Social Gatherings with Friends and Family: Once a week    Attends Religious Services: More than 4 times per year    Active Member of [employer] or Organizations: Yes    Attends Music therapist: More than 4 times per year    Marital Status: Married     Family History: The patient's family history is negative for Colon cancer and Stomach cancer.  ROS:   Please see the history of present illness.     All other systems reviewed and are negative.  EKGs/Labs/Other Studies Reviewed:    The following studies were reviewed today:   EKG: June 17, 2022: Normal sinus rhythm at 60.  Right bundle branch block.  Recent Labs: 11/06/2021: ALT 14; BUN 12; Creatinine, Ser 0.69; Hemoglobin 13.1; Platelets 282.0; Potassium 3.4; Sodium 139; TSH 0.36  Recent Lipid Panel    Component Value Date/Time   CHOL 267 (H) 04/10/2022 1108   TRIG 42.0 04/10/2022 1108   HDL 90.30 04/10/2022 1108   CHOLHDL 3 04/10/2022 1108   VLDL 8.4 04/10/2022 1108   LDLCALC 168 (H) 04/10/2022 1108   LDLCALC 128 (H) 08/01/2020 1455   LDLDIRECT 100.2 01/17/2013 1149     Risk Assessment/Calculations:      HYPERTENSION CONTROL Vitals:   06/17/22 1444 06/17/22 1500  BP: (!) 154/98 (!) 160/80    The patient's blood pressure is elevated above target today.  In order to  address the patient's elevated BP: A new medication was prescribed today.            Physical Exam:    VS:  BP (!) 160/80   Pulse 60   Ht '5\' 2"'$  (1.575 m)   Wt 134 lb (60.8 kg)   SpO2 99%   BMI 24.51 kg/m     Wt Readings from Last 3  Encounters:  06/17/22 134 lb (60.8 kg)  05/20/22 125 lb 1.6 oz (56.7 kg)  11/06/21 126 lb 6.4 oz (57.3 kg)     GEN:  Well nourished, well developed in no acute distress HEENT: Normal NECK: No JVD; No carotid bruits LYMPHATICS: No lymphadenopathy CARDIAC: RR ,  soft systolic murmur radiating out to her left axilary ,  below her Left  breast  RESPIRATORY:  Clear to auscultation without rales, wheezing or rhonchi  ABDOMEN: Soft, non-tender, non-distended MUSCULOSKELETAL:  No edema; No deformity  SKIN: Warm and dry NEUROLOGIC:  Alert and oriented x 3 PSYCHIATRIC:  Normal affect   ASSESSMENT:    1. Essential hypertension   2. Mixed hyperlipidemia   3. Palpitations    PLAN:     HTN:    will add Valsartan 80 mg a day .  Cont HCTZ 12.5  BMP , BMP in 4-6 weeks at her next office visit    2.  Palpitations:   fairly stable at this point .  Will consider an event monitor in the near future.  3.  Hyperlipidemia :   LDL is 168.   Will get a coronary calcium score .  She does not want to start a statin We will be able to better address her lipid-lowering therapy once we see what her calcium score is.            Medication Adjustments/Labs and Tests Ordered: Current medicines are reviewed at length with the patient today.  Concerns regarding medicines are outlined above.  Orders Placed This Encounter  Procedures   CT CARDIAC SCORING (SELF PAY ONLY)   Basic metabolic panel   EKG [id number]   Meds ordered this encounter  Medications   valsartan (DIOVAN) 80 MG tablet    Sig: Take 1 tablet (80 mg total) by mouth daily.    Dispense:  90 tablet    Refill:  3    Patient Instructions  Medication Instructions:  Your physician has  recommended you make the following change in your medication:   Start taking Valsartan 80 mg daily  *If you need a refill on your cardiac medications before your next appointment, please call your pharmacy*   Lab Work: BMET at next appointment If you have labs (blood work) drawn today and your tests are completely normal, you will receive your results only by: [place] (if you have MyChart) OR A paper copy in the mail If you have any lab test that is abnormal or we need to change your treatment, we will call you to review the results.  TEST Your physician has requested that you have a calcium score CT scan. There is a $99 fee for the scan.      Follow-Up: At [facility], you and your health needs are our priority.  As part of our continuing mission to provide you with exceptional heart care, we have created designated Provider Care Teams.  These Care Teams include your primary Cardiologist (physician) and Advanced Practice Providers (APPs -  Physician Assistants and Nurse Practitioners) who all work together to provide you with the care you need, when you need it.    Your next appointment:   4 week(s)  Provider:   Dr. Acie Fredrickson    Signed, Mertie Moores, MD  06/17/2022 4:53 PM    [place]

## 2022-06-17 ENCOUNTER — Ambulatory Visit: Payer: Medicare Other | Attending: Cardiovascular Disease | Admitting: Cardiovascular Disease

## 2022-06-17 ENCOUNTER — Encounter: Payer: Self-pay | Admitting: Cardiovascular Disease

## 2022-06-17 VITALS — BP 160/80 | HR 60 | Ht 62.0 in | Wt 134.0 lb

## 2022-06-17 DIAGNOSIS — I10 Essential (primary) hypertension: Secondary | ICD-10-CM | POA: Diagnosis not present

## 2022-06-17 DIAGNOSIS — R002 Palpitations: Secondary | ICD-10-CM | POA: Diagnosis not present

## 2022-06-17 DIAGNOSIS — E782 Mixed hyperlipidemia: Secondary | ICD-10-CM | POA: Insufficient documentation

## 2022-06-17 MED ORDER — VALSARTAN 80 MG PO TABS: 80.0000 mg | ORAL_TABLET | Freq: Every day | ORAL | 3 refills | Status: DC

## 2022-06-17 NOTE — Patient Instructions (Signed)
Medication Instructions:  Your physician has recommended you make the following change in your medication:   Start taking Valsartan 80 mg daily  *If you need a refill on your cardiac medications before your next appointment, please call your pharmacy*   Lab Work: BMET at next appointment If you have labs (blood work) drawn today and your tests are completely normal, you will receive your results only by: [place] (if you have MyChart) OR A paper copy in the mail If you have any lab test that is abnormal or we need to change your treatment, we will call you to review the results.  TEST Your physician has requested that you have a calcium score CT scan. There is a $99 fee for the scan.      Follow-Up: At [facility], you and your health needs are our priority.  As part of our continuing mission to provide you with exceptional heart care, we have created designated Provider Care Teams.  These Care Teams include your primary Cardiologist (physician) and Advanced Practice Providers (APPs -  Physician Assistants and Nurse Practitioners) who all work together to provide you with the care you need, when you need it.    Your next appointment:   4 week(s)  Provider:   Dr. Acie Fredrickson

## 2022-07-11 ENCOUNTER — Other Ambulatory Visit: Payer: Self-pay

## 2022-07-11 ENCOUNTER — Emergency Department (HOSPITAL_COMMUNITY)
Admission: EM | Admit: 2022-07-11 | Discharge: 2022-07-11 | Disposition: A | Discharge status: Home / Self Care | Payer: Medicare Other | Attending: Emergency Medicine | Admitting: Emergency Medicine

## 2022-07-11 ENCOUNTER — Encounter (HOSPITAL_COMMUNITY): Payer: Self-pay | Admitting: Emergency Medicine

## 2022-07-11 DIAGNOSIS — I10 Essential (primary) hypertension: Secondary | ICD-10-CM | POA: Diagnosis not present

## 2022-07-11 DIAGNOSIS — Z79899 Other long term (current) drug therapy: Secondary | ICD-10-CM | POA: Insufficient documentation

## 2022-07-11 LAB — BASIC METABOLIC PANEL
Anion gap: 7 (ref 5–15)
BUN: 16 mg/dL (ref 8–23)
CO2: 27 mmol/L (ref 22–32)
Calcium: 9.2 mg/dL (ref 8.9–10.3)
Chloride: 103 mmol/L (ref 98–111)
Creatinine, Ser: 0.76 mg/dL (ref 0.44–1.00)
GFR, Estimated: >60 mL/min (ref >60)
Glucose, Bld: 95 mg/dL (ref 70–99)
Potassium: 3.2 mmol/L — ABNORMAL LOW (ref 3.5–5.1)
Sodium: 137 mmol/L (ref 135–145)

## 2022-07-11 LAB — CBC
HCT: 39.8 % (ref 36.0–46.0)
Hemoglobin: 12.9 g/dL (ref 12.0–15.0)
MCH: 29.3 pg (ref 26.0–34.0)
MCHC: 32.4 g/dL (ref 30.0–36.0)
MCV: 90.2 fL (ref 80.0–100.0)
Platelets: 275 10*3/uL (ref 150–400)
RBC: 4.41 MIL/uL (ref 3.87–5.11)
RDW: 13.6 % (ref 11.5–15.5)
WBC: 4.1 10*3/uL (ref 4.0–10.5)
nRBC: 0 % (ref 0.0–0.2)

## 2022-07-11 MED ORDER — POTASSIUM CHLORIDE CRYS ER 20 MEQ PO TBCR
40.0000 meq | EXTENDED_RELEASE_TABLET | Freq: Once | ORAL | Status: AC
  Administered 2022-07-11: 40 meq via ORAL
  Filled 2022-07-11: qty 2

## 2022-07-11 NOTE — Discharge Instructions (Signed)
Please return to the ED with any new or worsening signs or symptoms such as chest pain, shortness of breath, headache, blurred vision Please follow-up with your PCP for further investigation of your antihypertensive medication regimen Please read the attached guides concerning hypertension, please also see the attached form that allows you to record your blood pressure at home Please continue monitoring her blood pressure at home as you have been doing As we discussed, your lab work here was reassuring.  Please continue to eat potassium rich foods as your potassium was slightly low.  Please follow-up with your primary care doctor in regards to your elevated blood pressure here tonight.  If you begin to develop shortness of breath, chest pain, blurred vision or headache please return to the ED.

## 2022-07-11 NOTE — ED Triage Notes (Signed)
Pt reports she has been having hypertension tonight. Tonight she started to feel flush and some minor tingling in her arms and legs.  Pt was able to ambulate to the room. She and her PCP have been working to adjust BP medications.  Pt is alert and oriented.  No other symptoms noted

## 2022-07-11 NOTE — ED Provider Notes (Signed)
[facility] Provider Note   CSN: [id number] Arrival date & time: 07/11/22  0230     History  Chief Complaint  Patient presents with   Hypertension    Tiffany Reed is a 82 y.o. female with medical history significant for carotid bruit, fluttering heart, cardiac arrhythmia, hypertension, mitral valve prolapse.  Patient presents to ED for evaluation hypertension.  Patient reports that for the last 1 year she has had elevated blood pressure readings.  Patient reports that prior to this, her blood pressure was largely normal.  Patient states that her and her PCP, Dr. Jerilee Hoh, have been attempting to find a suitable antihypertension medication regimen for her.  Patient reports that she recently was placed on 80 mg of valsartan along with her regular 12 and half milligrams of HCTZ.  Patient reports that she began taking valsartan 80 mg on March 1.  Patient states that over the last 3 days she has noticed that her blood pressure has been elevated.  Patient reports that she will typically measure her blood pressure once in the morning once at night.  Patient states that she checked her blood pressure tonight as she typically does before bed and noted to be elevated with a systolic reading of [id number].  Patient states that she attempted to go to bed after this however began to have subjective "pins-and-needles" sensation all throughout her body upon laying down however patient denies any numbness.  Patient states that this worried her, she advised her husband that she wanted to come to the ED for evaluation.  Patient states that this time she also took additional dose of 12 and half milligrams HCTZ, 80 mg valsartan.  Patient states that she has been compliant on antihypertension medication at home.  Patient denies any chest pain, shortness of breath, headache, blurred vision, nausea, vomiting, diarrhea, fevers, lightheadedness, dizziness, weakness, leg  swelling.   Hypertension       Home Medications Prior to Admission medications   Medication Sig Start Date End Date Taking? Authorizing Provider  hydrochlorothiazide (MICROZIDE) 12.5 MG capsule TAKE 1 CAPSULE BY MOUTH EVERY DAY 05/04/22   Isaac Bliss, Rayford Halsted, MD  ibandronate (BONIVA) 150 MG tablet Take 1 tablet (150 mg total) by mouth every 30 (thirty) days. Take in the morning with a full glass of water, on an empty stomach, and do not take anything else by mouth or lie down for the next 30 min. 11/06/21   Isaac Bliss, Rayford Halsted, MD  levothyroxine (SYNTHROID) 75 MCG tablet TAKE 1 TABLET BY MOUTH EVERY DAY BEFORE BREAKFAST 04/28/22   Isaac Bliss, Rayford Halsted, MD  valsartan (DIOVAN) 80 MG tablet Take 1 tablet (80 mg total) by mouth daily. 06/17/22   Nahser, Wonda Cheng, MD      Allergies    Sorbitol and Penicillins    Review of Systems   Review of Systems  Neurological:        Tingling  All other systems reviewed and are negative.   Physical Exam Updated Vital Signs BP 132/68   Pulse (!) 56   Temp 97.6 F (36.4 C) (Oral)   Resp 14   Ht 5\' 2"  (1.575 m)   Wt 59 kg   SpO2 100%   BMI 23.78 kg/m  Physical Exam Vitals and nursing note reviewed.  Constitutional:      General: She is not in acute distress.    Appearance: Normal appearance. She is not ill-appearing, toxic-appearing or diaphoretic.  HENT:     Head: Normocephalic and atraumatic.     Nose: Nose normal.     Mouth/Throat:     Mouth: Mucous membranes are moist.     Pharynx: Oropharynx is clear.  Eyes:     Extraocular Movements: Extraocular movements intact.     Conjunctiva/sclera: Conjunctivae normal.     Pupils: Pupils are equal, round, and reactive to light.  Cardiovascular:     Rate and Rhythm: Normal rate and regular rhythm.  Pulmonary:     Effort: Pulmonary effort is normal.     Breath sounds: Normal breath sounds. No wheezing.  Abdominal:     General: Abdomen is flat. Bowel sounds are normal.      Palpations: Abdomen is soft.     Tenderness: There is no abdominal tenderness.  Musculoskeletal:     Cervical back: Normal range of motion and neck supple. No tenderness.     Right lower leg: No edema.     Left lower leg: No edema.  Skin:    General: Skin is warm and dry.     Capillary Refill: Capillary refill takes less than 2 seconds.  Neurological:     Mental Status: She is alert and oriented to person, place, and time.     GCS: GCS eye subscore is 4. GCS verbal subscore is 5. GCS motor subscore is 6.     Cranial Nerves: Cranial nerves 2-12 are intact. No cranial nerve deficit.     Sensory: Sensation is intact. No sensory deficit.     Motor: Motor function is intact. No weakness.     Coordination: Coordination is intact. Heel to [facility] Test normal.     Comments: Intact finger-nose, heel-to-shin.  No pronator drift.  No slurred speech, facial droop.  Cranial nerves II through XII intact.  5 out of 5 grip strength upper extremities.  5 out of 5 strength bilateral lower extremities.  No dysarthria.  No subjective change in sensation, numbness.     ED Results / Procedures / Treatments   Labs (all labs ordered are listed, but only abnormal results are displayed) Labs Reviewed  BASIC METABOLIC PANEL - Abnormal; Notable for the following components:      Result Value   Potassium 3.2 (*)    All other components within normal limits  CBC    EKG EKG Interpretation  Date/Time:  Saturday July 11 2022 02:46:33 EDT Ventricular Rate:  53 PR Interval:  202 QRS Duration: 185 QT Interval:  514 QTC Calculation: 483 R Axis:   0 Text Interpretation: Sinus rhythm Right bundle branch block Confirmed by Georgina Snell [phone]) on 07/11/2022 3:05:03 AM  Radiology No results found.  Procedures Procedures   Medications Ordered in ED Medications  potassium chloride SA (KLOR-CON M) CR tablet 40 mEq (has no administration in time range)    ED Course/ Medical Decision Making/  A&P  Medical Decision Making Amount and/or Complexity of Data Reviewed Labs: ordered.  Risk Prescription drug management.   82 year old female presents to ED for evaluation.  Please see HPI for further details.  On examination the patient is afebrile and nontachycardic.  Lung sounds clear bilaterally, not hypoxic.  Abdomen soft and compressible throughout.  Neurological examination without focal neurodeficits.  Patient CBC without leukocytosis or anemia.  Patient BMP with slightly reduced potassium to 3.2 repleted with 40 mEq of oral potassium.  This is most likely secondary to patient HCTZ.  Upon reassessment, the patient reports that her tingling has resolved at this  time.  At this time the patient is stable for discharge.  The patient will be advised to follow-up with her PCP for reevaluation of blood pressure.  Patient will be encouraged to continue monitoring blood pressure at home.  Patient was advised to return to the ED with any new or worsening signs or symptoms such as chest pain, shortness of breath, blurred vision or headache.  Patient and patient husband at bedside voiced understanding with instructions.  Patient had all of her questions answered to her satisfaction.  The patient is stable for discharge.  Final Clinical Impression(s) / ED Diagnoses Final diagnoses:  Hypertension, unspecified type    Rx / DC Orders ED Discharge Orders     None         Azucena Cecil, PA-C 07/11/22 0423    Elgie Congo, MD 07/11/22 [phone]

## 2022-07-30 ENCOUNTER — Other Ambulatory Visit: Payer: Self-pay | Admitting: Cardiovascular Disease

## 2022-07-30 ENCOUNTER — Encounter: Payer: Self-pay | Admitting: Cardiovascular Disease

## 2022-07-30 ENCOUNTER — Ambulatory Visit (HOSPITAL_BASED_OUTPATIENT_CLINIC_OR_DEPARTMENT_OTHER)
Admission: RE | Admit: 2022-07-30 | Discharge: 2022-07-30 | Disposition: A | Discharge status: Home / Self Care | Payer: Medicare Other | Source: Ambulatory Visit | Attending: Cardiovascular Disease | Admitting: Cardiovascular Disease

## 2022-07-30 DIAGNOSIS — E782 Mixed hyperlipidemia: Secondary | ICD-10-CM | POA: Diagnosis not present

## 2022-07-30 DIAGNOSIS — I10 Essential (primary) hypertension: Secondary | ICD-10-CM | POA: Diagnosis not present

## 2022-07-30 NOTE — Progress Notes (Signed)
Cardiology Office Note:    Date:  07/31/2022   ID:  MYRL BYNUM, DOB Jun 16, 1940, MRN [id number]  PCP:  Maycie Luera Aspen, Limmie Patricia, MD   [facility] Providers Cardiologist:  Tibor Lemmons  Click to update primary MD,subspecialty MD or APP then REFRESH:1}    Referring MD: Nihal Doan Aspen, Estel*   Chief Complaint  Patient presents with   Hypertension         History of Present Illness:    Tiffany Reed is a 82 y.o. female with a hx of palpitations, carotid bruit, hypertension, mitral valve prolapse,  hypothyroidism  She brought some screening labs today to the office visit ( [facility] screenng )  Triglyceride level is 100 Total cholesterol is 282 HDL is 115 LDL is 147  Dr. Ardyth Harps started her on Atorva She has not started yet   Watches her salt but she eats out at least 2 times a week   Does not get any regular exercise  [place]  Does not like to walk the back roads  Has had palpitations,  He HCTZ was reduced in an effort to improve the palpitations   April 12,2024 Tiffany Reed is seen for follow up of her HLD, palpitations , MVP  Coronary CT score from April 11 is 88.8 which places her in the 61st percentile for age / sex matched controls.   Her LDL goal is 50-70./ She went to [facility] yesterday to have lipids drawn . Results are not back yet   She went to the ER with a "buzzing" all over her body and HTN     Past Medical History:  Diagnosis Date   Breast cancer 2005   right, s/p xrt   Carotid bruit    Dysrhythmia    had halter monitor 4/13-all ok for palpatations   Fluttering heart    and racing over the past several years with lightheadedness   H/O urinary incontinence    History of cardiac arrhythmia    Hypertension    Hypothyroidism    Mitral valve prolapse    Osteopenia    Personal history of radiation therapy     Past Surgical History:  Procedure Laterality Date   BREAST LUMPECTOMY  04/21/2003   right   EAR CYST  EXCISION Left 06/15/2012   Procedure: Left scaphoid cyst excision with distal radius graft;  Surgeon: Nicki Reaper, MD;  Location: [facility];  Service: Orthopedics;  Laterality: Left;  EXCISION CYST LEFT SCAPHOID DISTAL RADIUS GRAFT   EXCISION MORTON'S NEUROMA  04/21/1999   left foot   EYE SURGERY     GANGLION CYST EXCISION  04/21/2003   left thumb   HAMMER TOE SURGERY  04/20/2010   right 5th toe   HEMORROIDECTOMY  04/21/1971   HYSTEROSCOPY  2005,2002   NEUROPLASTY / TRANSPOSITION MEDIAN NERVE AT CARPAL TUNNEL BILATERAL  04/20/2000   PILONIDAL CYST / SINUS EXCISION  04/20/1966   plantar fibroma     4th toe, right foot   THYROID LOBECTOMY  04/20/2002    Current Medications: Current Meds  Medication Sig   atorvastatin (LIPITOR) 40 MG tablet Take 1 tablet (40 mg total) by mouth daily.   hydrochlorothiazide (MICROZIDE) 12.5 MG capsule TAKE 1 CAPSULE BY MOUTH EVERY DAY   ibandronate (BONIVA) 150 MG tablet Take 1 tablet (150 mg total) by mouth every 30 (thirty) days. Take in the morning with a full glass of water, on an empty stomach, and do not take anything  else by mouth or lie down for the next 30 min.   levothyroxine (SYNTHROID) 75 MCG tablet TAKE 1 TABLET BY MOUTH EVERY DAY BEFORE BREAKFAST   valsartan (DIOVAN) 80 MG tablet Take 1 tablet (80 mg total) by mouth daily.     Allergies:   Sorbitol and Penicillins   Social History   Socioeconomic History   Marital status: Married    Spouse name: Not on file   Number of children: Not on file   Years of education: Not on file   Highest education level: Bachelor's degree (e.g., BA, AB, BS)  Occupational History   Not on file  Tobacco Use   Smoking status: Never   Smokeless tobacco: Never  Substance and Sexual Activity   Alcohol use: Yes    Comment: rare   Drug use: No   Sexual activity: Not on file  Other Topics Concern   Not on file  Social History Narrative   Pt lives in [place].   Retired from the [employer].    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         Social Determinants of Health   Financial Resource Strain: Low Risk  (05/18/2022)   Overall Financial Resource Strain (CARDIA)    Difficulty of Paying Living Expenses: Not hard at all  Food Insecurity: No Food Insecurity (05/18/2022)   Hunger Vital Sign    Worried About Running Out of Food in the Last Year: Never true    Ran Out of Food in the Last Year: Never true  Transportation Needs: No Transportation Needs (05/18/2022)   PRAPARE - Administrator, Civil Service (Medical): No    Lack of Transportation (Non-Medical): No  Physical Activity: Unknown (05/18/2022)   Exercise Vital Sign    Days of Exercise per Week: 0 days    Minutes of Exercise per Session: Not on file  Stress: No Stress Concern Present (05/18/2022)   [employer] of Occupational Health - Occupational Stress Questionnaire    Feeling of Stress : Not at all  Social Connections: Moderately Integrated (05/18/2022)   Social Connection and Isolation Panel [NHANES]    Frequency of Communication with Friends and Family: Once a week    Frequency of Social Gatherings with Friends and Family: Once a week    Attends Religious Services: More than 4 times per year    Active Member of [employer] or Organizations: Yes    Attends Engineer, structural: More than 4 times per year    Marital Status: Married     Family History: The patient's family history is negative for Colon cancer and Stomach cancer.  ROS:   Please see the history of present illness.     All other systems reviewed and are negative.  EKGs/Labs/Other Studies Reviewed:    The following studies were reviewed today:   EKG:    Recent Labs: 11/06/2021: ALT 14; TSH 0.36 07/11/2022: Hemoglobin 12.9; Platelets 275 07/30/2022: BUN 24; Creatinine, Ser 0.74; Potassium 3.9; Sodium 141  Recent Lipid Panel    Component Value Date/Time   CHOL 267 (H) 04/10/2022 1108   TRIG 42.0 04/10/2022 1108   HDL 90.30  04/10/2022 1108   CHOLHDL 3 04/10/2022 1108   VLDL 8.4 04/10/2022 1108   LDLCALC 168 (H) 04/10/2022 1108   LDLCALC 128 (H) 08/01/2020 1455   LDLDIRECT 100.2 01/17/2013 1149     Risk Assessment/Calculations:                Physical Exam:  Physical Exam: Blood pressure 100/62, pulse 71, height 5\' 2"  (1.575 m), weight 131 lb (59.4 kg), SpO2 98 %.       GEN:  Well nourished, well developed in no acute distress HEENT: Normal NECK: No JVD; No carotid bruits LYMPHATICS: No lymphadenopathy CARDIAC: RRR soft systolic murmur below left breast , radiates to L ax line .Marland Kitchen. RESPIRATORY:  Clear to auscultation without rales, wheezing or rhonchi  ABDOMEN: Soft, non-tender, non-distended MUSCULOSKELETAL:  No edema; No deformity  SKIN: Warm and dry NEUROLOGIC:  Alert and oriented x 3   ASSESSMENT:    1. Mixed hyperlipidemia   2. Essential hypertension     PLAN:     HTN:  BP is well controlled. Today .  Cont current meds.       2.  Palpitations:      3.  Hyperlipidemia :   LDL was 168 ( Dec. 2023)  [facility] . We will be on the look out for this   4.  Mitral regurgitation:   has mild MR by exam.   Sounds fairly benign  Had mild MR by echo in 2013            Medication Adjustments/Labs and Tests Ordered: Current medicines are reviewed at length with the patient today.  Concerns regarding medicines are outlined above.  Orders Placed This Encounter  Procedures   ALT   Lipid panel   Basic metabolic panel   Meds ordered this encounter  Medications   atorvastatin (LIPITOR) 40 MG tablet    Sig: Take 1 tablet (40 mg total) by mouth daily.    Dispense:  90 tablet    Refill:  3    Patient Instructions  Medication Instructions:  Your physician has recommended you make the following change in your medication:   Atorvastatin 40 mg daily  *If you need a refill on your cardiac medications before your next appointment, please call your  pharmacy*   Lab Work: Lipids, ALT and BMP  If you have labs (blood work) drawn today and your tests are completely normal, you will receive your results only by: MyChart Message (if you have MyChart) OR A paper copy in the mail If you have any lab test that is abnormal or we need to change your treatment, we will call you to review the results.   Follow-Up: At [facility], you and your health needs are our priority.  As part of our continuing mission to provide you with exceptional heart care, we have created designated Provider Care Teams.  These Care Teams include your primary Cardiologist (physician) and Advanced Practice Providers (APPs -  Physician Assistants and Nurse Practitioners) who all work together to provide you with the care you need, when you need it.  Your next appointment:   1 year(s)  Provider:   Dr. Elease HashimotoNahser     Signed, Kristeen MissPhilip Valentine Barney, MD  07/31/2022 5:32 PM    [facility]

## 2022-07-31 ENCOUNTER — Encounter: Payer: Self-pay | Admitting: Cardiovascular Disease

## 2022-07-31 ENCOUNTER — Ambulatory Visit: Payer: Medicare Other | Attending: Cardiovascular Disease | Admitting: Cardiovascular Disease

## 2022-07-31 ENCOUNTER — Other Ambulatory Visit: Payer: Self-pay | Admitting: Internal Medicine

## 2022-07-31 VITALS — BP 100/62 | HR 71 | Ht 62.0 in | Wt 131.0 lb

## 2022-07-31 DIAGNOSIS — I10 Essential (primary) hypertension: Secondary | ICD-10-CM | POA: Diagnosis not present

## 2022-07-31 DIAGNOSIS — E782 Mixed hyperlipidemia: Secondary | ICD-10-CM

## 2022-07-31 DIAGNOSIS — E039 Hypothyroidism, unspecified: Secondary | ICD-10-CM

## 2022-07-31 LAB — BASIC METABOLIC PANEL
BUN/Creatinine Ratio: 32 — ABNORMAL HIGH (ref 12–28)
BUN: 24 mg/dL (ref 8–27)
CO2: 25 mmol/L (ref 20–29)
Calcium: 9.6 mg/dL (ref 8.7–10.3)
Chloride: 102 mmol/L (ref 96–106)
Creatinine, Ser: 0.74 mg/dL (ref 0.57–1.00)
Glucose: 89 mg/dL (ref 70–99)
Potassium: 3.9 mmol/L (ref 3.5–5.2)
Sodium: 141 mmol/L (ref 134–144)
eGFR: 82 mL/min/{1.73_m2} (ref >59)

## 2022-07-31 MED ORDER — ATORVASTATIN CALCIUM 40 MG PO TABS: 40.0000 mg | ORAL_TABLET | Freq: Every day | ORAL | 3 refills | Status: DC

## 2022-07-31 NOTE — Patient Instructions (Signed)
Medication Instructions:  Your physician has recommended you make the following change in your medication:   Atorvastatin 40 mg daily  *If you need a refill on your cardiac medications before your next appointment, please call your pharmacy*   Lab Work: Lipids, ALT and BMP  If you have labs (blood work) drawn today and your tests are completely normal, you will receive your results only by: MyChart Message (if you have MyChart) OR A paper copy in the mail If you have any lab test that is abnormal or we need to change your treatment, we will call you to review the results.   Follow-Up: At [facility], you and your health needs are our priority.  As part of our continuing mission to provide you with exceptional heart care, we have created designated Provider Care Teams.  These Care Teams include your primary Cardiologist (physician) and Advanced Practice Providers (APPs -  Physician Assistants and Nurse Practitioners) who all work together to provide you with the care you need, when you need it.  Your next appointment:   1 year(s)  Provider:   Dr. Elease Hashimoto

## 2022-10-30 ENCOUNTER — Ambulatory Visit: Payer: Medicare Other | Attending: Cardiovascular Disease

## 2022-10-30 DIAGNOSIS — E782 Mixed hyperlipidemia: Secondary | ICD-10-CM

## 2022-10-30 DIAGNOSIS — I10 Essential (primary) hypertension: Secondary | ICD-10-CM

## 2022-10-30 LAB — BASIC METABOLIC PANEL
BUN/Creatinine Ratio: 28 (ref 12–28)
BUN: 21 mg/dL (ref 8–27)
CO2: 27 mmol/L (ref 20–29)
Calcium: 9.6 mg/dL (ref 8.7–10.3)
Chloride: 103 mmol/L (ref 96–106)
Creatinine, Ser: 0.76 mg/dL (ref 0.57–1.00)
Glucose: 87 mg/dL (ref 70–99)
Potassium: 4.1 mmol/L (ref 3.5–5.2)
Sodium: 142 mmol/L (ref 134–144)
eGFR: 80 mL/min/{1.73_m2} (ref >59)

## 2022-10-30 LAB — LIPID PANEL
Chol/HDL Ratio: 1.8 ratio (ref 0.0–4.4)
Cholesterol, Total: 159 mg/dL (ref 100–199)
HDL: 87 mg/dL (ref >39)
LDL Chol Calc (NIH): 62 mg/dL (ref 0–99)
Triglycerides: 44 mg/dL (ref 0–149)
VLDL Cholesterol Cal: 10 mg/dL (ref 5–40)

## 2022-10-30 LAB — ALT: ALT: 15 IU/L (ref 0–32)

## 2022-11-03 ENCOUNTER — Encounter: Payer: Self-pay | Admitting: Cardiovascular Disease

## 2022-11-03 ENCOUNTER — Other Ambulatory Visit: Payer: Self-pay | Admitting: Internal Medicine

## 2022-11-03 DIAGNOSIS — I10 Essential (primary) hypertension: Secondary | ICD-10-CM

## 2022-12-23 ENCOUNTER — Ambulatory Visit: Payer: Medicare Other | Admitting: Family Medicine

## 2022-12-23 ENCOUNTER — Ambulatory Visit (INDEPENDENT_AMBULATORY_CARE_PROVIDER_SITE_OTHER): Payer: Medicare Other | Admitting: Family Medicine

## 2022-12-23 ENCOUNTER — Encounter: Payer: Self-pay | Admitting: Family Medicine

## 2022-12-23 VITALS — BP 114/72 | HR 56 | Temp 98.1°F | Wt 130.0 lb

## 2022-12-23 DIAGNOSIS — S8002XA Contusion of left knee, initial encounter: Secondary | ICD-10-CM

## 2022-12-23 DIAGNOSIS — S8012XA Contusion of left lower leg, initial encounter: Secondary | ICD-10-CM

## 2022-12-23 NOTE — Progress Notes (Signed)
   Subjective:    Patient ID: Lisa Roca, female    DOB: 1940/07/02, 82 y.o.   MRN: [id number]  HPI Here for an injury to the left knee that occurred at home 3 weeks ago. She tripped over a throw rug and fell forward, landing on both knees. Since then the left knee has been mildly swollen and is painful to walk on. It never bruised. She applied ice at first. There is no locking or giving way.    Review of Systems  Constitutional: Negative.   Respiratory: Negative.    Cardiovascular: Negative.   Musculoskeletal:  Positive for arthralgias.       Objective:   Physical Exam Constitutional:      Appearance: Normal appearance.     Comments: Walks easily   Cardiovascular:     Rate and Rhythm: Normal rate and regular rhythm.     Pulses: Normal pulses.     Heart sounds: Normal heart sounds.  Pulmonary:     Effort: Pulmonary effort is normal.     Breath sounds: Normal breath sounds.  Musculoskeletal:     Comments: Left knee has some mild swelling on the anterolateral shin just below the patella. No ecchymosis. No joint space tenderness. Her ROM is full   Neurological:     Mental Status: She is alert.           Assessment & Plan:  Left knee contusion. We will refer her to Orthopedics for further evaluation.  Gershon Crane, MD

## 2022-12-30 DIAGNOSIS — M25562 Pain in left knee: Secondary | ICD-10-CM | POA: Diagnosis not present

## 2022-12-30 DIAGNOSIS — M170 Bilateral primary osteoarthritis of knee: Secondary | ICD-10-CM | POA: Diagnosis not present

## 2022-12-30 DIAGNOSIS — M25561 Pain in right knee: Secondary | ICD-10-CM | POA: Diagnosis not present

## 2023-01-18 DIAGNOSIS — M170 Bilateral primary osteoarthritis of knee: Secondary | ICD-10-CM | POA: Diagnosis not present

## 2023-02-01 ENCOUNTER — Other Ambulatory Visit: Payer: Self-pay | Admitting: Internal Medicine

## 2023-02-01 ENCOUNTER — Ambulatory Visit (INDEPENDENT_AMBULATORY_CARE_PROVIDER_SITE_OTHER): Payer: Medicare Other

## 2023-02-01 VITALS — Ht 62.0 in | Wt 126.0 lb

## 2023-02-01 DIAGNOSIS — Z0000 Encounter for general adult medical examination without abnormal findings: Secondary | ICD-10-CM

## 2023-02-01 DIAGNOSIS — E782 Mixed hyperlipidemia: Secondary | ICD-10-CM

## 2023-02-01 DIAGNOSIS — M8580 Other specified disorders of bone density and structure, unspecified site: Secondary | ICD-10-CM

## 2023-02-01 NOTE — Patient Instructions (Addendum)
Ms. Galdamez , Thank you for taking time to come for your Medicare Wellness Visit. I appreciate your ongoing commitment to your health goals. Please review the following plan we discussed and let me know if I can assist you in the future.   Referrals/Orders/Follow-Ups/Clinician Recommendations:   This is a list of the screening recommended for you and due dates:  Health Maintenance  Topic Date Due   Hepatitis C Screening  Never done   DTaP/Tdap/Td vaccine (1 - Tdap) 01/18/2013   Flu Shot  11/19/2022   Mammogram  12/03/2022   COVID-19 Vaccine (7 - 2023-24 season) 12/20/2022   Medicare Annual Wellness Visit  02/01/2024   Pneumonia Vaccine  Completed   DEXA scan (bone density measurement)  Completed   Zoster (Shingles) Vaccine  Completed   HPV Vaccine  Aged Out   Colon Cancer Screening  Discontinued    Advanced directives: (Declined) Advance directive discussed with you today. Even though you declined this today, please call our office should you change your mind, and we can give you the proper paperwork for you to fill out.  Next Medicare Annual Wellness Visit scheduled for next year: Yes

## 2023-02-01 NOTE — Progress Notes (Signed)
Subjective:   Tiffany Reed is a 82 y.o. female who presents for Medicare Annual (Subsequent) preventive examination.  Visit Complete: Virtual I connected with  Tiffany Reed on 02/01/23 by a audio enabled telemedicine application and verified that I am speaking with the correct person using two identifiers.  Patient Location: Home  Provider Location: Home Office  I discussed the limitations of evaluation and management by telemedicine. The patient expressed understanding and agreed to proceed.  Vital Signs: Because this visit was a virtual/telehealth visit, some criteria may be missing or patient reported. Any vitals not documented were not able to be obtained and vitals that have been documented are patient reported.    Cardiac Risk Factors include: advanced age (>47men, >64 women);hypertension     Objective:    Today's Vitals   02/01/23 1517  Weight: 126 lb (57.2 kg)  Height: 5\' 2"  (1.575 m)   Body mass index is 23.05 kg/m.     02/01/2023    3:28 PM 06/09/2012    2:39 PM  Advanced Directives  Does Patient Have a Medical Advance Directive? No Patient does not have advance directive  Would patient like information on creating a medical advance directive? No - Patient declined     Current Medications (verified) Outpatient Encounter Medications as of 02/01/2023  Medication Sig   atorvastatin (LIPITOR) 20 MG tablet TAKE 1 TABLET BY MOUTH EVERY DAY   hydrochlorothiazide (MICROZIDE) 12.5 MG capsule TAKE 1 CAPSULE BY MOUTH EVERY DAY   levothyroxine (SYNTHROID) 75 MCG tablet TAKE 1 TABLET BY MOUTH EVERY DAY BEFORE BREAKFAST   valsartan (DIOVAN) 80 MG tablet Take 1 tablet (80 mg total) by mouth daily.   No facility-administered encounter medications on file as of 02/01/2023.    Allergies (verified) Sorbitol and Penicillins   History: Past Medical History:  Diagnosis Date   Breast cancer (HCC) 2005   right, s/p xrt   Carotid bruit    Dysrhythmia    had halter  monitor 4/13-all ok for palpatations   Fluttering heart    and racing over the past several years with lightheadedness   H/O urinary incontinence    History of cardiac arrhythmia    Hypertension    Hypothyroidism    Mitral valve prolapse    Osteopenia    Personal history of radiation therapy    Past Surgical History:  Procedure Laterality Date   BREAST LUMPECTOMY  04/21/2003   right   EAR CYST EXCISION Left 06/15/2012   Procedure: Left scaphoid cyst excision with distal radius graft;  Surgeon: Nicki Reaper, MD;  Location: [facility];  Service: Orthopedics;  Laterality: Left;  EXCISION CYST LEFT SCAPHOID DISTAL RADIUS GRAFT   EXCISION MORTON'S NEUROMA  04/21/1999   left foot   EYE SURGERY     GANGLION CYST EXCISION  04/21/2003   left thumb   HAMMER TOE SURGERY  04/20/2010   right 5th toe   HEMORROIDECTOMY  04/21/1971   HYSTEROSCOPY  2005,2002   NEUROPLASTY / TRANSPOSITION MEDIAN NERVE AT CARPAL TUNNEL BILATERAL  04/20/2000   PILONIDAL CYST / SINUS EXCISION  04/20/1966   plantar fibroma     4th toe, right foot   THYROID LOBECTOMY  04/20/2002   Family History  Problem Relation Age of Onset   Colon cancer Neg Hx    Stomach cancer Neg Hx    Social History   Socioeconomic History   Marital status: Married    Spouse name: Not on file  Number of children: Not on file   Years of education: Not on file   Highest education level: Bachelor's degree (e.g., BA, AB, BS)  Occupational History   Not on file  Tobacco Use   Smoking status: Never   Smokeless tobacco: Never  Substance and Sexual Activity   Alcohol use: Yes    Comment: rare   Drug use: No   Sexual activity: Not on file  Other Topics Concern   Not on file  Social History Narrative   Pt lives in [place].   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         Social Determinants of Health   Financial Resource Strain: Low Risk  (02/01/2023)   Overall Financial Resource Strain  (CARDIA)    Difficulty of Paying Living Expenses: Not hard at all  Food Insecurity: No Food Insecurity (02/01/2023)   Hunger Vital Sign    Worried About Running Out of Food in the Last Year: Never true    Ran Out of Food in the Last Year: Never true  Transportation Needs: No Transportation Needs (02/01/2023)   PRAPARE - Administrator, Civil Service (Medical): No    Lack of Transportation (Non-Medical): No  Physical Activity: Inactive (02/01/2023)   Exercise Vital Sign    Days of Exercise per Week: 0 days    Minutes of Exercise per Session: 0 min  Stress: No Stress Concern Present (02/01/2023)   [employer] of Occupational Health - Occupational Stress Questionnaire    Feeling of Stress : Not at all  Social Connections: Socially Integrated (02/01/2023)   Social Connection and Isolation Panel [NHANES]    Frequency of Communication with Friends and Family: More than three times a week    Frequency of Social Gatherings with Friends and Family: More than three times a week    Attends Religious Services: More than 4 times per year    Active Member of [employer] or Organizations: Yes    Attends Engineer, structural: More than 4 times per year    Marital Status: Married    Tobacco Counseling Counseling given: Not Answered   Clinical Intake:  Pre-visit preparation completed: Yes  Pain : No/denies pain     BMI - recorded: 23.05 Nutritional Status: BMI of 19-24  Normal Nutritional Risks: None Diabetes: No  How often do you need to have someone help you when you read instructions, pamphlets, or other written materials from your doctor or pharmacy?: 1 - Never  Interpreter Needed?: No  Information entered by :: Theresa Mulligan LPN   Activities of Daily Living    02/01/2023    3:23 PM  In your present state of health, do you have any difficulty performing the following activities:  Hearing? 0  Vision? 0  Difficulty concentrating or making decisions? 0   Walking or climbing stairs? 0  Dressing or bathing? 0  Doing errands, shopping? 0  Preparing Food and eating ? N  Using the Toilet? N  In the past six months, have you accidently leaked urine? N  Do you have problems with loss of bowel control? N  Managing your Medications? N  Managing your Finances? N  Housekeeping or managing your Housekeeping? N    Patient Care Team: Philip Aspen, Limmie Patricia, MD as PCP - General (Internal Medicine) Hillis Range, MD (Inactive) as Attending Physician (Cardiology)  Indicate any recent Medical Services you may have received from other than [facility] providers in the  past year (date may be approximate).     Assessment:   This is a routine wellness examination for Tiffany Reed.  Hearing/Vision screen Hearing Screening - Comments:: Denies hearing difficulties   Vision Screening - Comments:: Wears rx glasses - up to date with routine eye exams with  [facility]   Goals Addressed               This Visit's Progress     Lose weight (pt-stated)        Drop a couple of pounds.       Depression Screen    02/01/2023    3:21 PM 12/23/2022    3:40 PM 05/20/2022    2:37 PM 11/06/2021    2:04 PM 08/01/2020    2:55 PM 05/13/2018    3:50 PM 12/18/2014    1:23 PM  PHQ 2/9 Scores  PHQ - 2 Score 0 0  0 0 0 0  PHQ- 9 Score 0 0  0 0 0   Exception Documentation   Patient refusal        Fall Risk    02/01/2023    3:24 PM 05/20/2022    2:37 PM 05/18/2022    4:18 PM 11/06/2021    2:05 PM 08/01/2020    2:11 PM  Fall Risk   Falls in the past year? 1 0 0 0 1  Number falls in past yr: 0 0  0 0  Injury with Fall? 0 0  0 1  Comment Followed by medical attention      Risk for fall due to : No Fall Risks No Fall Risks  No Fall Risks   Follow up Falls prevention discussed Falls evaluation completed  Falls evaluation completed     MEDICARE RISK AT HOME: Medicare Risk at Home Any stairs in or around the home?: Yes If so, are there any without handrails?:  No Home free of loose throw rugs in walkways, pet beds, electrical cords, etc?: Yes Adequate lighting in your home to reduce risk of falls?: Yes Life alert?: No Use of a cane, walker or w/c?: No Grab bars in the bathroom?: No Shower chair or bench in shower?: No Elevated toilet seat or a handicapped toilet?: No  TIMED UP AND GO:  Was the test performed?  No    Cognitive Function:        02/01/2023    3:28 PM  6CIT Screen  What Year? 0 points  What month? 0 points  What time? 0 points  Count back from 20 0 points  Months in reverse 0 points  Repeat phrase 0 points  Total Score 0 points    Immunizations Immunization History  Administered Date(s) Administered   Fluad Quad(high Dose 65+) 02/22/2020, 03/09/2022   Hep A / Hep B 01/27/2011, 03/18/2011, 12/25/2011   Influenza Split 01/27/2011, 12/25/2011   Influenza Whole 01/25/2008, 04/07/2010   Influenza, High Dose Seasonal PF 03/07/2014, 01/03/2016, 01/08/2017, 04/04/2018, 04/01/2021   Influenza,inj,Quad PF,6+ Mos 01/17/2013   Influenza-Unspecified 01/04/2017   PFIZER Comirnaty(Gray Top)Covid-19 Tri-Sucrose Vaccine 09/18/2020   PFIZER(Purple Top)SARS-COV-2 Vaccination 05/25/2019, 06/20/2019, 03/06/2020   Pfizer Covid-19 Vaccine Bivalent Booster 13yrs & up 02/17/2021   Pfizer(Comirnaty)Fall Seasonal Vaccine 12 years and older 03/09/2022   Pneumococcal Conjugate-13 12/18/2014, 11/26/2018   Pneumococcal Polysaccharide-23 12/25/2011   Tetanus 01/17/2013   Zoster Recombinant(Shingrix) 05/23/2018, 11/26/2018   Zoster, Live 12/26/2010    TDAP status: Due, Education has been provided regarding the importance of this vaccine. Advised may receive this vaccine  at local pharmacy or Health Dept. Aware to provide a copy of the vaccination record if obtained from local pharmacy or Health Dept. Verbalized acceptance and understanding.  Flu Vaccine status: Due, Education has been provided regarding the importance of this vaccine.  Advised may receive this vaccine at local pharmacy or Health Dept. Aware to provide a copy of the vaccination record if obtained from local pharmacy or Health Dept. Verbalized acceptance and understanding.  Pneumococcal vaccine status: Up to date  Covid-19 vaccine status: Declined, Education has been provided regarding the importance of this vaccine but patient still declined. Advised may receive this vaccine at local pharmacy or Health Dept.or vaccine clinic. Aware to provide a copy of the vaccination record if obtained from local pharmacy or Health Dept. Verbalized acceptance and understanding.  Qualifies for Shingles Vaccine? Yes    Zostavax completed Yes   Shingrix Completed?: Yes  Screening Tests Health Maintenance  Topic Date Due   Hepatitis C Screening  Never done   DTaP/Tdap/Td (1 - Tdap) 01/18/2013   INFLUENZA VACCINE  11/19/2022   MAMMOGRAM  12/03/2022   COVID-19 Vaccine (7 - 2023-24 season) 12/20/2022   Medicare Annual Wellness (AWV)  02/01/2024   Pneumonia Vaccine 48+ Years old  Completed   DEXA SCAN  Completed   Zoster Vaccines- Shingrix  Completed   HPV VACCINES  Aged Out   Colonoscopy  Discontinued    Health Maintenance  Health Maintenance Due  Topic Date Due   Hepatitis C Screening  Never done   DTaP/Tdap/Td (1 - Tdap) 01/18/2013   INFLUENZA VACCINE  11/19/2022   MAMMOGRAM  12/03/2022   COVID-19 Vaccine (7 - 2023-24 season) 12/20/2022   Bone Density status: Completed 05/01/22. Results reflect: Bone density results: OSTEOPOROSIS. Repeat every   years.    Additional Screening:    Vision Screening: Recommended annual ophthalmology exams for early detection of glaucoma and other disorders of the eye. Is the patient up to date with their annual eye exam?  Yes  Who is the provider or what is the name of the office in which the patient attends annual eye exams? [facility] If pt is not established with a provider, would they like to be referred to a provider  to establish care? No .   Dental Screening: Recommended annual dental exams for proper oral hygiene  Community Resource Referral / Chronic Care Management:  CRR required this visit?  No   CCM required this visit?  No     Plan:     I have personally reviewed and noted the following in the patient's chart:   Medical and social history Use of alcohol, tobacco or illicit drugs  Current medications and supplements including opioid prescriptions. Patient is not currently taking opioid prescriptions. Functional ability and status Nutritional status Physical activity Advanced directives List of other physicians Hospitalizations, surgeries, and ER visits in previous 12 months Vitals Screenings to include cognitive, depression, and falls Referrals and appointments  In addition, I have reviewed and discussed with patient certain preventive protocols, quality metrics, and best practice recommendations. A written personalized care plan for preventive services as well as general preventive health recommendations were provided to patient.     Tillie Rung, LPN   [id number]   After Visit Summary: (MyChart) Due to this being a telephonic visit, the after visit summary with patients personalized plan was offered to patient via MyChart   Nurse Notes: None

## 2023-02-22 ENCOUNTER — Ambulatory Visit (INDEPENDENT_AMBULATORY_CARE_PROVIDER_SITE_OTHER): Payer: Medicare Other | Admitting: Podiatry

## 2023-02-22 ENCOUNTER — Encounter: Payer: Self-pay | Admitting: Podiatry

## 2023-02-22 DIAGNOSIS — M7752 Other enthesopathy of left foot: Secondary | ICD-10-CM | POA: Diagnosis not present

## 2023-02-22 MED ORDER — TRIAMCINOLONE ACETONIDE 10 MG/ML IJ SUSP
10.0000 mg | Freq: Once | INTRAMUSCULAR | Status: AC
Start: 2023-02-22 — End: 2023-02-22
  Administered 2023-02-22: 10 mg via INTRA_ARTICULAR

## 2023-02-23 NOTE — Progress Notes (Signed)
Subjective:   Patient ID: Tiffany Reed, female   DOB: 82 y.o.   MRN: [id number]   HPI Patient presents with inflammation underneath the second metatarsal phalangeal joint left and 2 small keratotic lesions which are mildly tender.  States this has become more bothersome for her recently and at times can be hard to walk on   ROS      Objective:  Physical Exam  Neurovascular status intact inflammation pain subsecond MPJ left fluid buildup around the joint surface     Assessment:  Inflammatory capsulitis second MPJ left plantar     Plan:  H&P reviewed sterile prep injected around the capsule 3 mg dexamethasone Kenalog 5 mg Xylocaine and advised if this does not get better may need to consider surgical intervention.  I did do courtesy trimming of lesions

## 2023-04-20 ENCOUNTER — Other Ambulatory Visit: Payer: Self-pay | Admitting: Internal Medicine

## 2023-04-20 DIAGNOSIS — E039 Hypothyroidism, unspecified: Secondary | ICD-10-CM

## 2023-04-30 ENCOUNTER — Other Ambulatory Visit: Payer: Self-pay | Admitting: Internal Medicine

## 2023-04-30 DIAGNOSIS — I10 Essential (primary) hypertension: Secondary | ICD-10-CM

## 2023-05-04 ENCOUNTER — Emergency Department (HOSPITAL_COMMUNITY): Payer: No Typology Code available for payment source

## 2023-05-04 ENCOUNTER — Other Ambulatory Visit: Payer: Self-pay

## 2023-05-04 ENCOUNTER — Encounter (HOSPITAL_COMMUNITY): Payer: Self-pay

## 2023-05-04 ENCOUNTER — Inpatient Hospital Stay (HOSPITAL_COMMUNITY)
Admission: EM | Admit: 2023-05-04 | Discharge: 2023-05-07 | DRG: 184 | Disposition: A | Discharge status: Home / Self Care | Payer: No Typology Code available for payment source | Attending: Surgery | Admitting: Surgery

## 2023-05-04 DIAGNOSIS — S15192A Other specified injury of left vertebral artery, initial encounter: Secondary | ICD-10-CM | POA: Diagnosis present

## 2023-05-04 DIAGNOSIS — S2220XA Unspecified fracture of sternum, initial encounter for closed fracture: Secondary | ICD-10-CM | POA: Diagnosis not present

## 2023-05-04 DIAGNOSIS — Z79890 Hormone replacement therapy (postmenopausal): Secondary | ICD-10-CM | POA: Diagnosis not present

## 2023-05-04 DIAGNOSIS — I700 Atherosclerosis of aorta: Secondary | ICD-10-CM | POA: Diagnosis present

## 2023-05-04 DIAGNOSIS — R072 Precordial pain: Secondary | ICD-10-CM | POA: Diagnosis not present

## 2023-05-04 DIAGNOSIS — S2242XA Multiple fractures of ribs, left side, initial encounter for closed fracture: Secondary | ICD-10-CM | POA: Diagnosis not present

## 2023-05-04 DIAGNOSIS — Z79899 Other long term (current) drug therapy: Secondary | ICD-10-CM | POA: Diagnosis not present

## 2023-05-04 DIAGNOSIS — S12100A Unspecified displaced fracture of second cervical vertebra, initial encounter for closed fracture: Secondary | ICD-10-CM | POA: Diagnosis not present

## 2023-05-04 DIAGNOSIS — R0781 Pleurodynia: Secondary | ICD-10-CM | POA: Diagnosis not present

## 2023-05-04 DIAGNOSIS — S12112A Nondisplaced Type II dens fracture, initial encounter for closed fracture: Secondary | ICD-10-CM | POA: Diagnosis not present

## 2023-05-04 DIAGNOSIS — R918 Other nonspecific abnormal finding of lung field: Secondary | ICD-10-CM | POA: Diagnosis not present

## 2023-05-04 DIAGNOSIS — I6502 Occlusion and stenosis of left vertebral artery: Secondary | ICD-10-CM | POA: Diagnosis not present

## 2023-05-04 DIAGNOSIS — I4510 Unspecified right bundle-branch block: Secondary | ICD-10-CM | POA: Diagnosis present

## 2023-05-04 DIAGNOSIS — Z041 Encounter for examination and observation following transport accident: Secondary | ICD-10-CM | POA: Diagnosis not present

## 2023-05-04 DIAGNOSIS — S15102A Unspecified injury of left vertebral artery, initial encounter: Secondary | ICD-10-CM | POA: Diagnosis not present

## 2023-05-04 DIAGNOSIS — I6523 Occlusion and stenosis of bilateral carotid arteries: Secondary | ICD-10-CM | POA: Diagnosis not present

## 2023-05-04 DIAGNOSIS — Z853 Personal history of malignant neoplasm of breast: Secondary | ICD-10-CM | POA: Diagnosis not present

## 2023-05-04 DIAGNOSIS — S2222XA Fracture of body of sternum, initial encounter for closed fracture: Secondary | ICD-10-CM | POA: Diagnosis present

## 2023-05-04 DIAGNOSIS — Y92410 Unspecified street and highway as the place of occurrence of the external cause: Secondary | ICD-10-CM | POA: Diagnosis not present

## 2023-05-04 DIAGNOSIS — I341 Nonrheumatic mitral (valve) prolapse: Secondary | ICD-10-CM | POA: Diagnosis present

## 2023-05-04 DIAGNOSIS — E785 Hyperlipidemia, unspecified: Secondary | ICD-10-CM | POA: Diagnosis present

## 2023-05-04 DIAGNOSIS — S2243XA Multiple fractures of ribs, bilateral, initial encounter for closed fracture: Principal | ICD-10-CM | POA: Diagnosis present

## 2023-05-04 DIAGNOSIS — M810 Age-related osteoporosis without current pathological fracture: Secondary | ICD-10-CM | POA: Diagnosis present

## 2023-05-04 DIAGNOSIS — Z880 Allergy status to penicillin: Secondary | ICD-10-CM | POA: Diagnosis not present

## 2023-05-04 DIAGNOSIS — G8929 Other chronic pain: Secondary | ICD-10-CM | POA: Diagnosis not present

## 2023-05-04 DIAGNOSIS — R0789 Other chest pain: Secondary | ICD-10-CM | POA: Diagnosis not present

## 2023-05-04 DIAGNOSIS — I6782 Cerebral ischemia: Secondary | ICD-10-CM | POA: Diagnosis not present

## 2023-05-04 DIAGNOSIS — R0989 Other specified symptoms and signs involving the circulatory and respiratory systems: Secondary | ICD-10-CM | POA: Diagnosis not present

## 2023-05-04 DIAGNOSIS — Z91048 Other nonmedicinal substance allergy status: Secondary | ICD-10-CM

## 2023-05-04 DIAGNOSIS — T1490XA Injury, unspecified, initial encounter: Secondary | ICD-10-CM | POA: Diagnosis present

## 2023-05-04 DIAGNOSIS — S2241XA Multiple fractures of ribs, right side, initial encounter for closed fracture: Secondary | ICD-10-CM | POA: Diagnosis not present

## 2023-05-04 DIAGNOSIS — S3991XA Unspecified injury of abdomen, initial encounter: Secondary | ICD-10-CM | POA: Diagnosis not present

## 2023-05-04 DIAGNOSIS — I10 Essential (primary) hypertension: Secondary | ICD-10-CM | POA: Diagnosis not present

## 2023-05-04 DIAGNOSIS — I517 Cardiomegaly: Secondary | ICD-10-CM | POA: Diagnosis not present

## 2023-05-04 DIAGNOSIS — I2510 Atherosclerotic heart disease of native coronary artery without angina pectoris: Secondary | ICD-10-CM | POA: Diagnosis not present

## 2023-05-04 DIAGNOSIS — I672 Cerebral atherosclerosis: Secondary | ICD-10-CM | POA: Diagnosis not present

## 2023-05-04 DIAGNOSIS — Z923 Personal history of irradiation: Secondary | ICD-10-CM | POA: Diagnosis not present

## 2023-05-04 DIAGNOSIS — J90 Pleural effusion, not elsewhere classified: Secondary | ICD-10-CM | POA: Diagnosis not present

## 2023-05-04 DIAGNOSIS — E890 Postprocedural hypothyroidism: Secondary | ICD-10-CM | POA: Diagnosis not present

## 2023-05-04 LAB — CBC WITH DIFFERENTIAL/PLATELET
Abs Immature Granulocytes: 0.25 10*3/uL — ABNORMAL HIGH (ref 0.00–0.07)
Basophils Absolute: 0 10*3/uL (ref 0.0–0.1)
Basophils Relative: 0 %
Eosinophils Absolute: 0.1 10*3/uL (ref 0.0–0.5)
Eosinophils Relative: 0 %
HCT: 38.4 % (ref 36.0–46.0)
Hemoglobin: 12.5 g/dL (ref 12.0–15.0)
Immature Granulocytes: 2 %
Lymphocytes Relative: 8 %
Lymphs Abs: 1 10*3/uL (ref 0.7–4.0)
MCH: 29.6 pg (ref 26.0–34.0)
MCHC: 32.6 g/dL (ref 30.0–36.0)
MCV: 90.8 fL (ref 80.0–100.0)
Monocytes Absolute: 0.6 10*3/uL (ref 0.1–1.0)
Monocytes Relative: 4 %
Neutro Abs: 11.5 10*3/uL — ABNORMAL HIGH (ref 1.7–7.7)
Neutrophils Relative %: 86 %
Platelets: 383 10*3/uL (ref 150–400)
RBC: 4.23 MIL/uL (ref 3.87–5.11)
RDW: 13.3 % (ref 11.5–15.5)
WBC: 13.4 10*3/uL — ABNORMAL HIGH (ref 4.0–10.5)
nRBC: 0 % (ref 0.0–0.2)

## 2023-05-04 LAB — COMPREHENSIVE METABOLIC PANEL
ALT: 34 U/L (ref 0–44)
AST: 45 U/L — ABNORMAL HIGH (ref 15–41)
Albumin: 3.6 g/dL (ref 3.5–5.0)
Alkaline Phosphatase: 136 U/L — ABNORMAL HIGH (ref 38–126)
Anion gap: 10 (ref 5–15)
BUN: 39 mg/dL — ABNORMAL HIGH (ref 8–23)
CO2: 25 mmol/L (ref 22–32)
Calcium: 9.2 mg/dL (ref 8.9–10.3)
Chloride: 103 mmol/L (ref 98–111)
Creatinine, Ser: 0.88 mg/dL (ref 0.44–1.00)
GFR, Estimated: >60 mL/min (ref >60)
Glucose, Bld: 99 mg/dL (ref 70–99)
Potassium: 4.2 mmol/L (ref 3.5–5.1)
Sodium: 138 mmol/L (ref 135–145)
Total Bilirubin: 0.7 mg/dL (ref 0.0–1.2)
Total Protein: 6.4 g/dL — ABNORMAL LOW (ref 6.5–8.1)

## 2023-05-04 LAB — PROTIME-INR
INR: 1 (ref 0.8–1.2)
Prothrombin Time: 13.7 s (ref 11.4–15.2)

## 2023-05-04 LAB — I-STAT CG4 LACTIC ACID, ED: Lactic Acid, Venous: 1.1 mmol/L (ref 0.5–1.9)

## 2023-05-04 MED ORDER — HYDROMORPHONE HCL 1 MG/ML IJ SOLN
0.5000 mg | Freq: Once | INTRAMUSCULAR | Status: AC
  Administered 2023-05-04: 0.5 mg via INTRAVENOUS
  Filled 2023-05-04: qty 1

## 2023-05-04 MED ORDER — ONDANSETRON HCL 4 MG/2ML IJ SOLN
4.0000 mg | Freq: Four times a day (QID) | INTRAMUSCULAR | Status: DC | PRN
  Administered 2023-05-05: 4 mg via INTRAVENOUS
  Filled 2023-05-04: qty 2

## 2023-05-04 MED ORDER — DOCUSATE SODIUM 100 MG PO CAPS
100.0000 mg | ORAL_CAPSULE | Freq: Two times a day (BID) | ORAL | Status: DC
  Administered 2023-05-05 – 2023-05-07 (×5): 100 mg via ORAL
  Filled 2023-05-04 (×5): qty 1

## 2023-05-04 MED ORDER — TRAMADOL HCL 50 MG PO TABS: 25.0000 mg | ORAL_TABLET | Freq: Four times a day (QID) | ORAL | Status: DC | PRN

## 2023-05-04 MED ORDER — MELATONIN 3 MG PO TABS: 3.0000 mg | ORAL_TABLET | Freq: Every evening | ORAL | Status: DC | PRN

## 2023-05-04 MED ORDER — METOPROLOL TARTRATE 5 MG/5ML IV SOLN: 5.0000 mg | Freq: Four times a day (QID) | INTRAVENOUS | Status: DC | PRN

## 2023-05-04 MED ORDER — OXYCODONE HCL 5 MG PO TABS
5.0000 mg | ORAL_TABLET | ORAL | Status: DC | PRN
  Administered 2023-05-05: 5 mg via ORAL
  Filled 2023-05-04: qty 1

## 2023-05-04 MED ORDER — HYDRALAZINE HCL 20 MG/ML IJ SOLN: 10.0000 mg | INTRAMUSCULAR | Status: DC | PRN

## 2023-05-04 MED ORDER — IOHEXOL 350 MG/ML SOLN
75.0000 mL | Freq: Once | INTRAVENOUS | Status: AC | PRN
  Administered 2023-05-04: 75 mL via INTRAVENOUS

## 2023-05-04 MED ORDER — ENOXAPARIN SODIUM 30 MG/0.3ML IJ SOSY
30.0000 mg | PREFILLED_SYRINGE | Freq: Two times a day (BID) | INTRAMUSCULAR | Status: DC
  Administered 2023-05-06 – 2023-05-07 (×3): 30 mg via SUBCUTANEOUS
  Filled 2023-05-04 (×3): qty 0.3

## 2023-05-04 MED ORDER — ONDANSETRON 4 MG PO TBDP: 4.0000 mg | ORAL_TABLET | Freq: Four times a day (QID) | ORAL | Status: DC | PRN

## 2023-05-04 MED ORDER — MORPHINE SULFATE (PF) 4 MG/ML IV SOLN
4.0000 mg | Freq: Once | INTRAVENOUS | Status: AC
  Administered 2023-05-04: 4 mg via INTRAVENOUS
  Filled 2023-05-04: qty 1

## 2023-05-04 MED ORDER — GABAPENTIN 100 MG PO CAPS
100.0000 mg | ORAL_CAPSULE | Freq: Three times a day (TID) | ORAL | Status: DC
  Administered 2023-05-05 – 2023-05-07 (×8): 100 mg via ORAL
  Filled 2023-05-04 (×8): qty 1

## 2023-05-04 MED ORDER — HYDROMORPHONE HCL 1 MG/ML IJ SOLN: 0.5000 mg | INTRAMUSCULAR | Status: DC | PRN

## 2023-05-04 MED ORDER — ACETAMINOPHEN 500 MG PO TABS
1000.0000 mg | ORAL_TABLET | Freq: Four times a day (QID) | ORAL | Status: DC
  Administered 2023-05-05 – 2023-05-07 (×9): 1000 mg via ORAL
  Filled 2023-05-04 (×9): qty 2

## 2023-05-04 MED ORDER — METHOCARBAMOL 500 MG PO TABS
500.0000 mg | ORAL_TABLET | Freq: Three times a day (TID) | ORAL | Status: DC
Start: 2023-05-04 — End: 2023-05-07
  Administered 2023-05-05 – 2023-05-06 (×5): 500 mg via ORAL
  Filled 2023-05-04 (×5): qty 1

## 2023-05-04 MED ORDER — METHOCARBAMOL 1000 MG/10ML IJ SOLN
500.0000 mg | Freq: Three times a day (TID) | INTRAMUSCULAR | Status: DC
  Filled 2023-05-04: qty 10

## 2023-05-04 MED ORDER — KETOROLAC TROMETHAMINE 15 MG/ML IJ SOLN: 15.0000 mg | Freq: Four times a day (QID) | INTRAMUSCULAR | Status: DC | PRN

## 2023-05-04 MED ORDER — POLYETHYLENE GLYCOL 3350 17 G PO PACK: 17.0000 g | PACK | Freq: Every day | ORAL | Status: DC | PRN
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
## 2023-05-05 ENCOUNTER — Inpatient Hospital Stay (HOSPITAL_COMMUNITY): Payer: No Typology Code available for payment source

## 2023-05-05 LAB — URINALYSIS, ROUTINE W REFLEX MICROSCOPIC
Bacteria, UA: NONE SEEN
Bilirubin Urine: NEGATIVE
Glucose, UA: NEGATIVE mg/dL
Ketones, ur: NEGATIVE mg/dL
Leukocytes,Ua: NEGATIVE
Nitrite: NEGATIVE
Protein, ur: NEGATIVE mg/dL
Specific Gravity, Urine: >1.046 — ABNORMAL HIGH (ref 1.005–1.030)
pH: 5 (ref 5.0–8.0)

## 2023-05-05 LAB — BASIC METABOLIC PANEL
Anion gap: 10 (ref 5–15)
BUN: 30 mg/dL — ABNORMAL HIGH (ref 8–23)
CO2: 28 mmol/L (ref 22–32)
Calcium: 9.2 mg/dL (ref 8.9–10.3)
Chloride: 99 mmol/L (ref 98–111)
Creatinine, Ser: 0.82 mg/dL (ref 0.44–1.00)
GFR, Estimated: >60 mL/min (ref >60)
Glucose, Bld: 122 mg/dL — ABNORMAL HIGH (ref 70–99)
Potassium: 3.9 mmol/L (ref 3.5–5.1)
Sodium: 137 mmol/L (ref 135–145)

## 2023-05-05 LAB — CBC
HCT: 40.2 % (ref 36.0–46.0)
Hemoglobin: 12.9 g/dL (ref 12.0–15.0)
MCH: 29.2 pg (ref 26.0–34.0)
MCHC: 32.1 g/dL (ref 30.0–36.0)
MCV: 91 fL (ref 80.0–100.0)
Platelets: 414 10*3/uL — ABNORMAL HIGH (ref 150–400)
RBC: 4.42 MIL/uL (ref 3.87–5.11)
RDW: 13.2 % (ref 11.5–15.5)
WBC: 12.9 10*3/uL — ABNORMAL HIGH (ref 4.0–10.5)
nRBC: 0 % (ref 0.0–0.2)

## 2023-05-05 MED ORDER — ASPIRIN 81 MG PO TBEC
81.0000 mg | DELAYED_RELEASE_TABLET | Freq: Every day | ORAL | Status: DC
  Administered 2023-05-05 – 2023-05-07 (×3): 81 mg via ORAL
  Filled 2023-05-05 (×3): qty 1

## 2023-05-05 MED ORDER — IOHEXOL 350 MG/ML SOLN
60.0000 mL | Freq: Once | INTRAVENOUS | Status: AC | PRN
  Administered 2023-05-05: 60 mL via INTRAVENOUS

## 2023-05-05 MED ORDER — SODIUM CHLORIDE 0.9 % IV SOLN: INTRAVENOUS | Status: AC

## 2023-05-05 MED ORDER — ATORVASTATIN CALCIUM 10 MG PO TABS
20.0000 mg | ORAL_TABLET | Freq: Every day | ORAL | Status: DC
Start: 2023-05-05 — End: 2023-05-07
  Administered 2023-05-05 – 2023-05-07 (×3): 20 mg via ORAL
  Filled 2023-05-05: qty 1
  Filled 2023-05-05 (×2): qty 2

## 2023-05-05 MED ORDER — OXYCODONE HCL 5 MG PO TABS
5.0000 mg | ORAL_TABLET | ORAL | Status: DC | PRN
  Administered 2023-05-05: 5 mg via ORAL
  Administered 2023-05-06: 7.5 mg via ORAL
  Filled 2023-05-05: qty 2
  Filled 2023-05-05 (×2): qty 1

## 2023-05-05 MED ORDER — HYDROCHLOROTHIAZIDE 12.5 MG PO TABS
12.5000 mg | ORAL_TABLET | Freq: Every day | ORAL | Status: DC
  Administered 2023-05-05 – 2023-05-06 (×2): 12.5 mg via ORAL
  Filled 2023-05-05 (×2): qty 1

## 2023-05-05 MED ORDER — IRBESARTAN 150 MG PO TABS
75.0000 mg | ORAL_TABLET | Freq: Every day | ORAL | Status: DC
  Administered 2023-05-05 – 2023-05-06 (×2): 75 mg via ORAL
  Filled 2023-05-05 (×3): qty 1

## 2023-05-05 MED ORDER — HYDROCHLOROTHIAZIDE 12.5 MG PO CAPS
12.5000 mg | ORAL_CAPSULE | Freq: Every day | ORAL | Status: DC
  Filled 2023-05-05: qty 1

## 2023-05-05 MED ORDER — LEVOTHYROXINE SODIUM 75 MCG PO TABS
75.0000 ug | ORAL_TABLET | Freq: Every day | ORAL | Status: DC
  Administered 2023-05-05 – 2023-05-07 (×3): 75 ug via ORAL
  Filled 2023-05-05 (×3): qty 1
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
## 2023-05-05 NOTE — Progress Notes (Addendum)
   05/05/23 1336  Vitals  Temp (!) 97.5 F (36.4 C)  Temp Source Oral  BP (!) 144/61  MAP (mmHg) 84  BP Location Right Arm  BP Method Automatic  Patient Position (if appropriate) Lying  Pulse Rate (!) 53  Pulse Rate Source Monitor  ECG Heart Rate (!) 53  Resp 18  Level of Consciousness  Level of Consciousness Alert  MEWS COLOR  MEWS Score Color Green  Oxygen Therapy  SpO2 96 %  O2 Device Room Air  Pain Assessment  Pain Scale 0-10  Pain Score 10  Pain Type Acute pain  Pain Location Rib cage  Pain Orientation Right;Left  Pain Descriptors / Indicators Aching;Discomfort;Constant  Pain Frequency Constant  Pain Onset On-going  Patients Stated Pain Goal 0  Pain Intervention(s) Medication (See eMAR);Repositioned;Emotional support;Food  Complaints & Interventions  Neuro symptoms relieved by Rest  Hot/Cold Interventions  Hot/Cold Interventions Warm blankets  PCA/Epidural/Spinal Assessment  Respiratory Pattern Regular;Unlabored  Glasgow Coma Scale  Eye Opening 4  Best Verbal Response (NON-intubated) 5  Best Motor Response 6  Glasgow Coma Scale Score 15  MEWS Score  MEWS Temp 0  MEWS Systolic 0  MEWS Pulse 0  MEWS RR 0  MEWS LOC 0  MEWS Score 0   Pt arrived from ED with personal belongings. Pt A&Ox4, and moves all extremities x4. C-collar on and aligned. Ecchymosis around left eye noted. Skin assessment completed with second RN per hospital policy and no pressure injuries noted. CCMD called and telemetry applied. Pt oriented to room and call light system. Bed in lowest position with bed alarm on and call bell within reach. Pt sister at bedside.

## 2023-05-05 NOTE — TOC CAGE-AID Note (Signed)
 Transition of Care [facility]) - CAGE-AID Screening   Patient Details  Name: Tiffany Reed MRN: [id number] Date of Birth: May 19, 1940   Misty Amour, RN Phone Number: 05/05/2023, 6:31 AM   Clinical Narrative:  Pt denies tobacco, drug use. Rare etoh usage. No resources needed.   CAGE-AID Screening:    Have You Ever Felt You Ought to Cut Down on Your Drinking or Drug Use?: No Have People Annoyed You By Critizing Your Drinking Or Drug Use?: No Have You Felt Bad Or Guilty About Your Drinking Or Drug Use?: No Have You Ever Had a Drink or Used Drugs First Thing In The Morning to Steady Your Nerves or to Get Rid of a Hangover?: No CAGE-AID Score: 0  Substance Abuse Education Offered: No
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
## 2023-05-06 LAB — BASIC METABOLIC PANEL
Anion gap: 10 (ref 5–15)
BUN: 25 mg/dL — ABNORMAL HIGH (ref 8–23)
CO2: 25 mmol/L (ref 22–32)
Calcium: 8.8 mg/dL — ABNORMAL LOW (ref 8.9–10.3)
Chloride: 101 mmol/L (ref 98–111)
Creatinine, Ser: 0.93 mg/dL (ref 0.44–1.00)
GFR, Estimated: >60 mL/min (ref >60)
Glucose, Bld: 97 mg/dL (ref 70–99)
Potassium: 4 mmol/L (ref 3.5–5.1)
Sodium: 136 mmol/L (ref 135–145)

## 2023-05-06 LAB — CBC
HCT: 38.5 % (ref 36.0–46.0)
Hemoglobin: 12.5 g/dL (ref 12.0–15.0)
MCH: 29.3 pg (ref 26.0–34.0)
MCHC: 32.5 g/dL (ref 30.0–36.0)
MCV: 90.2 fL (ref 80.0–100.0)
Platelets: 354 10*3/uL (ref 150–400)
RBC: 4.27 MIL/uL (ref 3.87–5.11)
RDW: 13.4 % (ref 11.5–15.5)
WBC: 9.7 10*3/uL (ref 4.0–10.5)
nRBC: 0 % (ref 0.0–0.2)

## 2023-05-06 MED ORDER — METHOCARBAMOL 500 MG PO TABS
1000.0000 mg | ORAL_TABLET | Freq: Three times a day (TID) | ORAL | Status: DC
  Administered 2023-05-06 – 2023-05-07 (×3): 1000 mg via ORAL
  Filled 2023-05-06 (×3): qty 2

## 2023-05-06 MED ORDER — LIDOCAINE 5 % EX PTCH
2.0000 | MEDICATED_PATCH | CUTANEOUS | Status: DC
  Administered 2023-05-06 – 2023-05-07 (×2): 2 via TRANSDERMAL
  Filled 2023-05-06 (×2): qty 2

## 2023-05-06 MED ORDER — POLYETHYLENE GLYCOL 3350 17 G PO PACK
17.0000 g | PACK | Freq: Two times a day (BID) | ORAL | Status: DC
  Administered 2023-05-06 – 2023-05-07 (×3): 17 g via ORAL
  Filled 2023-05-06 (×3): qty 1
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
## 2023-05-06 NOTE — Progress Notes (Signed)
Patient started desating into the 70s while sleeping. RN placed 2L [place]. Patient now sating at 99%
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
## 2023-05-06 NOTE — Plan of Care (Signed)
  Problem: Education: Goal: Knowledge of General Education information will improve Description: Including pain rating scale, medication(s)/side effects and non-pharmacologic comfort measures Outcome: Progressing   Problem: Health Behavior/Discharge Planning: Goal: Ability to manage health-related needs will improve Outcome: Progressing   Problem: Clinical Measurements: Goal: Ability to maintain clinical measurements within normal limits will improve Outcome: Progressing   Problem: Activity: Goal: Risk for activity intolerance will decrease Outcome: Progressing   Problem: Nutrition: Goal: Adequate nutrition will be maintained Outcome: Progressing   Problem: Coping: Goal: Level of anxiety will decrease Outcome: Progressing   Problem: Pain Managment: Goal: General experience of comfort will improve and/or be controlled Outcome: Progressing

## 2023-05-07 ENCOUNTER — Other Ambulatory Visit (HOSPITAL_COMMUNITY): Payer: Self-pay

## 2023-05-07 ENCOUNTER — Telehealth (HOSPITAL_COMMUNITY): Payer: Self-pay | Admitting: Pharmacy Technician

## 2023-05-07 MED ORDER — GABAPENTIN 100 MG PO CAPS
100.0000 mg | ORAL_CAPSULE | Freq: Three times a day (TID) | ORAL | 0 refills | Status: DC
  Filled 2023-05-07: qty 90, 30d supply, fill #0

## 2023-05-07 MED ORDER — GUAIFENESIN ER 600 MG PO TB12
600.0000 mg | ORAL_TABLET | Freq: Two times a day (BID) | ORAL | Status: DC
  Administered 2023-05-07: 600 mg via ORAL
  Filled 2023-05-07: qty 1

## 2023-05-07 MED ORDER — METHOCARBAMOL 500 MG PO TABS
1000.0000 mg | ORAL_TABLET | Freq: Three times a day (TID) | ORAL | 0 refills | Status: DC | PRN
  Filled 2023-05-07: qty 80, 14d supply, fill #0

## 2023-05-07 MED ORDER — ACETAMINOPHEN 500 MG PO TABS: 1000.0000 mg | ORAL_TABLET | Freq: Three times a day (TID) | ORAL | Status: DC | PRN

## 2023-05-07 MED ORDER — OXYCODONE HCL 5 MG PO TABS
5.0000 mg | ORAL_TABLET | ORAL | 0 refills | Status: DC | PRN
  Filled 2023-05-07: qty 20, 5d supply, fill #0

## 2023-05-07 MED ORDER — ENSURE ENLIVE PO LIQD: 237.0000 mL | Freq: Two times a day (BID) | ORAL | Status: DC

## 2023-05-07 MED ORDER — POLYETHYLENE GLYCOL 3350 17 G PO PACK: 17.0000 g | PACK | Freq: Two times a day (BID) | ORAL | Status: DC | PRN

## 2023-05-07 MED ORDER — ASPIRIN 81 MG PO TBEC
81.0000 mg | DELAYED_RELEASE_TABLET | Freq: Every day | ORAL | 0 refills | Status: DC
  Filled 2023-05-07: qty 90, 90d supply, fill #0

## 2023-05-07 MED ORDER — GUAIFENESIN ER 600 MG PO TB12: 600.0000 mg | ORAL_TABLET | Freq: Two times a day (BID) | ORAL | Status: DC | PRN

## 2023-05-07 MED ORDER — LIDOCAINE 5 % EX PTCH
2.0000 | MEDICATED_PATCH | CUTANEOUS | 0 refills | Status: DC
  Filled 2023-05-07: qty 30, 15d supply, fill #0

## 2023-05-07 MED ORDER — DOCUSATE SODIUM 100 MG PO CAPS: 100.0000 mg | ORAL_CAPSULE | Freq: Two times a day (BID) | ORAL | Status: DC | PRN

## 2023-05-07 NOTE — Care Management Important Message (Signed)
Important Message  Patient Details  Name: Tiffany Reed MRN: [id number] Date of Birth: 10/28/1940   ImpoPatient     [name] 05/07/2023, 11:37 AM
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
## 2023-05-07 NOTE — Telephone Encounter (Signed)
Pharmacy Patient Advocate Encounter  Received notification from [facility] that Prior Authorization for Lidocaine 5% patches  has been APPROVED from 05/07/2023 to 05/06/2024   PA #/Case ID/Reference #: [id number]

## 2023-05-07 NOTE — Telephone Encounter (Signed)
Pharmacy Patient Advocate Encounter   Received notification that prior authorization for Lidocaine 5% patches is required/requested.   Insurance verification completed.   The patient is insured through [facility] .   Per test claim: PA required; PA submitted to above mentioned insurance via CoverMyMeds Key/confirmation #/EOC [id number] Status is pending
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
## 2023-05-07 NOTE — Progress Notes (Signed)
Patient got dressed with the assistance of OT. Patient initially saying that they wouldn't be ready to discharge until 3pm today d/t her husband needing to run errands, thinking that patient may need to wait down in the discharge lounge. However, patient's husband at the bedside shortly after I went over discharge paperwork with her. Patient's husband agrees to take down the patient belongings and bring the car around front where we will have the patient ready for pickup. Felicia NT took out patient's IV and wheeled patient down to their car.
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
## 2023-05-10 ENCOUNTER — Telehealth: Payer: Self-pay | Admitting: Cardiovascular Disease
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
## 2023-05-11 NOTE — Telephone Encounter (Signed)
Pt c/o swelling/edema: STAT if pt has developed SOB within 24 hours  If swelling, where is the swelling located?  Toes  How much weight have you gained and in what time span?     Have you gained 2 pounds in a day or 5 pounds in a week?  about 4 lbs in a 4 days  Do you have a log of your daily weights (if so, list)?   Yes  Are you currently taking a fluid pill?  No  Are you currently SOB?   No  Have you traveled recently in a car or plane for an extended period of time?   Patient called again to follow-up on re-starting her valsartan (DIOVAN) 80 MG tablet (Paused) and hydrochlorothiazide (MICROZIDE) 12.5 MG capsule (Paused) .  Patient stated she is having swelling and her BP has been trending high.  171/87 165/86 166/84 159/84  Patient wants a call back on next steps.
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
## 2023-05-24 ENCOUNTER — Other Ambulatory Visit: Payer: Self-pay | Admitting: Internal Medicine

## 2023-05-24 DIAGNOSIS — I7774 Dissection of vertebral artery: Secondary | ICD-10-CM | POA: Diagnosis not present

## 2023-05-24 DIAGNOSIS — S12112A Nondisplaced Type II dens fracture, initial encounter for closed fracture: Secondary | ICD-10-CM | POA: Diagnosis not present

## 2023-05-24 DIAGNOSIS — S12100D Unspecified displaced fracture of second cervical vertebra, subsequent encounter for fracture with routine healing: Secondary | ICD-10-CM | POA: Diagnosis not present

## 2023-05-24 DIAGNOSIS — I10 Essential (primary) hypertension: Secondary | ICD-10-CM

## 2023-05-27 ENCOUNTER — Encounter: Payer: Self-pay | Admitting: Cardiovascular Disease
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
## 2023-05-28 ENCOUNTER — Ambulatory Visit: Payer: Medicare Other | Attending: Cardiovascular Disease | Admitting: Cardiovascular Disease

## 2023-05-28 ENCOUNTER — Encounter: Payer: Self-pay | Admitting: Cardiovascular Disease

## 2023-05-28 VITALS — BP 134/66 | HR 66 | Ht 62.0 in | Wt 133.4 lb

## 2023-05-28 DIAGNOSIS — I340 Nonrheumatic mitral (valve) insufficiency: Secondary | ICD-10-CM | POA: Insufficient documentation

## 2023-05-28 DIAGNOSIS — R002 Palpitations: Secondary | ICD-10-CM | POA: Diagnosis present

## 2023-05-28 NOTE — Patient Instructions (Signed)
 Follow-Up: At [facility], you and your health needs are our priority.  As part of our continuing mission to provide you with exceptional heart care, we have created designated Provider Care Teams.  These Care Teams include your primary Cardiologist (physician) and Advanced Practice Providers (APPs -  Physician Assistants and Nurse Practitioners) who all work together to provide you with the care you need, when you need it.  We recommend signing up for the patient portal called MyChart.  Sign up information is provided on this After Visit Summary.  MyChart is used to connect with patients for Virtual Visits (Telemedicine).  Patients are able to view lab/test results, encounter notes, upcoming appointments, etc.  Non-urgent messages can be sent to your provider as well.   To learn more about what you can do with MyChart, go to [email or web address].    Your next appointment:   As scheduled  Provider:   Alveta, MD  Other Instructions Kardia Mobile - heart monitor ( [employer] is the company)  Catha is a smart device that can record a medical-grade electrocardiogram (EKG) right on your smartphone. EKGs measure the electrical activity of your heart and are used in hospitals to detect irregularities with your heart rate or rhythm, which may be indicators of a heart condition such as atrial fibrillation (AFib). [employer]ia[employer] records a single-lead EKG, which provides you and your doctor with reliable information on your heart health. K[employer]iaMobile can detect AFib, Bradycardia, and Tachycardia, with more determinations available with a [employer]. Catha is clinically validated, CE marked, and FDA-cleared, making it one of the most reliable ways to check in on your heart from home. Kardia Mobile device by [employer]  is approximately $90 and the phone application is free.  The web site is:  https://www.alivecor.com   Kardia Mobile - sending an EKG Download app and set  up profile. Run EKG - by placing 1-2 fingers on the silver plates After EKG is complete - Download PDF - Skip password (if you apply a password the provider will need it to view the EKG) Click share button (square with upward arrow) in bottom left corner To send: choose MyChart (first time log into MyChart) Pop up window about sending ECG Click continue Choose type of message Choose provider Type subject and message Click send (EKG should be attached) - To send additional EKGs in one message click the paperclip image and bottom of page to attach.

## 2023-05-29 ENCOUNTER — Other Ambulatory Visit: Payer: Self-pay | Admitting: Cardiovascular Disease
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
## 2023-05-31 NOTE — Telephone Encounter (Signed)
 This Rx was supposed to be restarted but wasn't. Please correct this.

## 2023-06-12 ENCOUNTER — Other Ambulatory Visit: Payer: Self-pay | Admitting: Neurosurgery

## 2023-06-12 DIAGNOSIS — I7774 Dissection of vertebral artery: Secondary | ICD-10-CM

## 2023-06-22 ENCOUNTER — Ambulatory Visit
Admission: RE | Admit: 2023-06-22 | Discharge: 2023-06-22 | Disposition: A | Discharge status: Home / Self Care | Payer: Medicare Other | Source: Ambulatory Visit | Attending: Neurosurgery | Admitting: Neurosurgery

## 2023-06-22 DIAGNOSIS — I7774 Dissection of vertebral artery: Secondary | ICD-10-CM

## 2023-06-22 MED ORDER — IOPAMIDOL (ISOVUE-370) INJECTION 76%
500.0000 mL | Freq: Once | INTRAVENOUS | Status: AC | PRN
  Administered 2023-06-22: 75 mL via INTRAVENOUS

## 2023-07-19 DIAGNOSIS — S12100D Unspecified displaced fracture of second cervical vertebra, subsequent encounter for fracture with routine healing: Secondary | ICD-10-CM | POA: Diagnosis not present

## 2023-07-19 DIAGNOSIS — I7774 Dissection of vertebral artery: Secondary | ICD-10-CM | POA: Diagnosis not present

## 2023-07-23 ENCOUNTER — Other Ambulatory Visit: Payer: Self-pay | Admitting: Internal Medicine

## 2023-07-23 DIAGNOSIS — E039 Hypothyroidism, unspecified: Secondary | ICD-10-CM

## 2023-07-27 ENCOUNTER — Other Ambulatory Visit: Payer: Self-pay | Admitting: Internal Medicine

## 2023-07-27 DIAGNOSIS — E782 Mixed hyperlipidemia: Secondary | ICD-10-CM

## 2023-08-01 ENCOUNTER — Encounter: Payer: Self-pay | Admitting: Cardiovascular Disease
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
## 2023-08-02 ENCOUNTER — Ambulatory Visit: Payer: Medicare Other | Attending: Cardiovascular Disease | Admitting: Cardiovascular Disease

## 2023-08-02 ENCOUNTER — Encounter: Payer: Self-pay | Admitting: Cardiovascular Disease

## 2023-08-02 VITALS — BP 110/68 | HR 63 | Ht 62.0 in | Wt 134.4 lb

## 2023-08-02 DIAGNOSIS — R002 Palpitations: Secondary | ICD-10-CM | POA: Diagnosis not present

## 2023-08-02 DIAGNOSIS — I10 Essential (primary) hypertension: Secondary | ICD-10-CM | POA: Diagnosis not present

## 2023-08-02 DIAGNOSIS — E782 Mixed hyperlipidemia: Secondary | ICD-10-CM | POA: Diagnosis not present

## 2023-08-02 NOTE — Patient Instructions (Signed)
 Medication Instructions:  Your physician has recommended you make the following change in your medication:   1) STOP aspirin  *If you need a refill on your cardiac medications before your next appointment, please call your pharmacy*  Follow-Up: At [facility], you and your health needs are our priority.  As part of our continuing mission to provide you with exceptional heart care, we have created designated Provider Care Teams.  These Care Teams include your primary Cardiologist (physician) and Advanced Practice Providers (APPs -  Physician Assistants and Nurse Practitioners) who all work together to provide you with the care you need, when you need it.  Your next appointment:   1 year(s)  The format for your next appointment:   In Person  Provider:   Ahmad Alert, MD {  Other Instructions   1st Floor: - Lobby - Registration  - Pharmacy  - Lab - Cafe  2nd Floor: - PV Lab - Diagnostic Testing (echo, CT, nuclear med)  3rd Floor: - Vacant  4th Floor: - TCTS (cardiothoracic surgery) - AFib Clinic - Structural Heart Clinic - Vascular Surgery  - Vascular Ultrasound  5th Floor: - HeartCare Cardiology (general and EP) - Clinical Pharmacy for coumadin, hypertension, lipid, weight-loss medications, and med management appointments    Valet parking services will be available as well.

## 2023-08-09 ENCOUNTER — Other Ambulatory Visit: Payer: Self-pay | Admitting: Internal Medicine

## 2023-08-09 DIAGNOSIS — I10 Essential (primary) hypertension: Secondary | ICD-10-CM

## 2023-08-12 ENCOUNTER — Other Ambulatory Visit: Payer: Self-pay | Admitting: Internal Medicine

## 2023-08-12 DIAGNOSIS — E039 Hypothyroidism, unspecified: Secondary | ICD-10-CM

## 2023-08-12 NOTE — Telephone Encounter (Signed)
 Copied from CRM [id number]. Topic: Clinical - Medication Refill >> Aug 12, 2023 10:49 AM Georgeann Kindred wrote: Most Recent Primary Care Visit:  Provider: Dewayne Ford  Department: [facility]  Visit Type: MEDICARE AWV, SEQUENTIAL  Date: 02/01/2023  Medication: levothyroxine  (SYNTHROID ) 75 MCG tablet  Has the patient contacted their pharmacy? Yes (Agent: If no, request that the patient contact the pharmacy for the refill. If patient does not wish to contact the pharmacy document the reason why and proceed with request.) (Agent: If yes, when and what did the pharmacy advise?)  Is this the correct pharmacy for this prescription? Yes If no, delete pharmacy and type the correct one.  This is the patient's preferred pharmacy:  [id number] - [place], [place] - [address] AT [facility] [address]  [place] [address] Phone: [phone] Fax: [phone]    Has the prescription been filled recently? No  Is the patient out of the medication? Yes  Has the patient been seen for an appointment in the last year OR does the patient have an upcoming appointment? Yes  Can we respond through MyChart? Yes  Agent: Please be advised that Rx refills may take up to 3 business days. We ask that you follow-up with your pharmacy.

## 2023-08-16 MED ORDER — LEVOTHYROXINE SODIUM 75 MCG PO TABS: ORAL_TABLET | ORAL | 0 refills | Status: DC

## 2023-08-17 ENCOUNTER — Ambulatory Visit (INDEPENDENT_AMBULATORY_CARE_PROVIDER_SITE_OTHER): Admitting: Family Medicine

## 2023-08-17 VITALS — BP 104/68 | HR 65 | Temp 98.0°F | Wt 135.0 lb

## 2023-08-17 DIAGNOSIS — E039 Hypothyroidism, unspecified: Secondary | ICD-10-CM | POA: Diagnosis not present

## 2023-08-17 MED ORDER — LEVOTHYROXINE SODIUM 75 MCG PO TABS: ORAL_TABLET | ORAL | 0 refills | Status: DC

## 2023-08-17 NOTE — Progress Notes (Signed)
   Subjective:    Patient ID: Tiffany Reed, female    DOB: [birth year], 83 y.o.   MRN: [id number]  HPI Here asking for refills for her Levothyroxine . She normally sees Dr. Ival Marines, but her thyroid  levels have not been checked in 2 years. She feels fine.    Review of Systems  Constitutional: Negative.   Respiratory: Negative.    Cardiovascular: Negative.        Objective:   Physical Exam        Assessment & Plan:  Hypothyroidism. We sent in a 90 day refill of the Levothyroxine , but I urged her to stop at the check out desk to make an appt soon to see Dr. Freddie Jaegers and to have labs drawn.  Corita Diego, MD

## 2023-09-06 ENCOUNTER — Other Ambulatory Visit: Payer: Self-pay | Admitting: Internal Medicine

## 2023-09-06 DIAGNOSIS — E039 Hypothyroidism, unspecified: Secondary | ICD-10-CM

## 2023-09-06 NOTE — Telephone Encounter (Signed)
 Copied from CRM [id number]. Topic: Clinical - Medication Refill >> Sep 06, 2023  3:36 PM Corin V wrote: Medication: levothyroxine  (SYNTHROID ) 75 MCG tablet  Has the patient contacted their pharmacy? Yes (Agent: If no, request that the patient contact the pharmacy for the refill. If patient does not wish to contact the pharmacy document the reason why and proceed with request.) (Agent: If yes, when and what did the pharmacy advise?)  This is the patient's preferred pharmacy:  [id number] - [place], [place] - [address] AT [facility] [address] [place] [address] Phone: [phone] Fax: [phone]  Is this the correct pharmacy for this prescription? Yes If no, delete pharmacy and type the correct one.   Has the prescription been filled recently? No  Is the patient out of the medication? No  Has the patient been seen for an appointment in the last year OR does the patient have an upcoming appointment? Yes  Can we respond through MyChart? Yes  Agent: Please be advised that Rx refills may take up to 3 business days. We ask that you follow-up with your pharmacy.

## 2023-09-07 MED ORDER — LEVOTHYROXINE SODIUM 75 MCG PO TABS: ORAL_TABLET | ORAL | 0 refills | Status: DC

## 2023-09-21 DIAGNOSIS — M6281 Muscle weakness (generalized): Secondary | ICD-10-CM | POA: Diagnosis not present

## 2023-09-21 DIAGNOSIS — M542 Cervicalgia: Secondary | ICD-10-CM | POA: Diagnosis not present

## 2023-09-21 DIAGNOSIS — R262 Difficulty in walking, not elsewhere classified: Secondary | ICD-10-CM | POA: Diagnosis not present

## 2023-09-21 DIAGNOSIS — R293 Abnormal posture: Secondary | ICD-10-CM | POA: Diagnosis not present

## 2023-09-24 DIAGNOSIS — M6281 Muscle weakness (generalized): Secondary | ICD-10-CM | POA: Diagnosis not present

## 2023-09-24 DIAGNOSIS — M542 Cervicalgia: Secondary | ICD-10-CM | POA: Diagnosis not present

## 2023-09-24 DIAGNOSIS — R262 Difficulty in walking, not elsewhere classified: Secondary | ICD-10-CM | POA: Diagnosis not present

## 2023-09-24 DIAGNOSIS — R293 Abnormal posture: Secondary | ICD-10-CM | POA: Diagnosis not present

## 2023-09-28 DIAGNOSIS — M6281 Muscle weakness (generalized): Secondary | ICD-10-CM | POA: Diagnosis not present

## 2023-09-28 DIAGNOSIS — M542 Cervicalgia: Secondary | ICD-10-CM | POA: Diagnosis not present

## 2023-09-28 DIAGNOSIS — R293 Abnormal posture: Secondary | ICD-10-CM | POA: Diagnosis not present

## 2023-09-28 DIAGNOSIS — R262 Difficulty in walking, not elsewhere classified: Secondary | ICD-10-CM | POA: Diagnosis not present

## 2023-10-05 DIAGNOSIS — R293 Abnormal posture: Secondary | ICD-10-CM | POA: Diagnosis not present

## 2023-10-05 DIAGNOSIS — R262 Difficulty in walking, not elsewhere classified: Secondary | ICD-10-CM | POA: Diagnosis not present

## 2023-10-05 DIAGNOSIS — M6281 Muscle weakness (generalized): Secondary | ICD-10-CM | POA: Diagnosis not present

## 2023-10-05 DIAGNOSIS — M542 Cervicalgia: Secondary | ICD-10-CM | POA: Diagnosis not present

## 2023-10-08 DIAGNOSIS — R293 Abnormal posture: Secondary | ICD-10-CM | POA: Diagnosis not present

## 2023-10-08 DIAGNOSIS — M542 Cervicalgia: Secondary | ICD-10-CM | POA: Diagnosis not present

## 2023-10-08 DIAGNOSIS — R262 Difficulty in walking, not elsewhere classified: Secondary | ICD-10-CM | POA: Diagnosis not present

## 2023-10-08 DIAGNOSIS — M6281 Muscle weakness (generalized): Secondary | ICD-10-CM | POA: Diagnosis not present

## 2023-10-12 ENCOUNTER — Ambulatory Visit (INDEPENDENT_AMBULATORY_CARE_PROVIDER_SITE_OTHER): Admitting: Internal Medicine

## 2023-10-12 ENCOUNTER — Encounter: Payer: Self-pay | Admitting: Internal Medicine

## 2023-10-12 VITALS — BP 98/64 | HR 70 | Temp 98.2°F | Wt 128.1 lb

## 2023-10-12 DIAGNOSIS — E039 Hypothyroidism, unspecified: Secondary | ICD-10-CM

## 2023-10-12 DIAGNOSIS — E782 Mixed hyperlipidemia: Secondary | ICD-10-CM

## 2023-10-12 DIAGNOSIS — M6281 Muscle weakness (generalized): Secondary | ICD-10-CM | POA: Diagnosis not present

## 2023-10-12 DIAGNOSIS — I10 Essential (primary) hypertension: Secondary | ICD-10-CM

## 2023-10-12 DIAGNOSIS — L03116 Cellulitis of left lower limb: Secondary | ICD-10-CM | POA: Diagnosis not present

## 2023-10-12 DIAGNOSIS — M542 Cervicalgia: Secondary | ICD-10-CM | POA: Diagnosis not present

## 2023-10-12 DIAGNOSIS — R262 Difficulty in walking, not elsewhere classified: Secondary | ICD-10-CM | POA: Diagnosis not present

## 2023-10-12 DIAGNOSIS — M816 Localized osteoporosis [Lequesne]: Secondary | ICD-10-CM | POA: Diagnosis not present

## 2023-10-12 DIAGNOSIS — R293 Abnormal posture: Secondary | ICD-10-CM | POA: Diagnosis not present

## 2023-10-12 MED ORDER — CEPHALEXIN 500 MG PO CAPS: 500.0000 mg | ORAL_CAPSULE | Freq: Two times a day (BID) | ORAL | 0 refills | Status: AC
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
## 2023-10-13 ENCOUNTER — Ambulatory Visit: Payer: Self-pay | Admitting: Internal Medicine

## 2023-10-13 DIAGNOSIS — E559 Vitamin D deficiency, unspecified: Secondary | ICD-10-CM | POA: Insufficient documentation

## 2023-10-13 LAB — COMPREHENSIVE METABOLIC PANEL WITH GFR
ALT: 14 U/L (ref 0–35)
AST: 22 U/L (ref 0–37)
Albumin: 4.3 g/dL (ref 3.5–5.2)
Alkaline Phosphatase: 66 U/L (ref 39–117)
BUN: 42 mg/dL — ABNORMAL HIGH (ref 6–23)
CO2: 26 meq/L (ref 19–32)
Calcium: 9.4 mg/dL (ref 8.4–10.5)
Chloride: 102 meq/L (ref 96–112)
Creatinine, Ser: 1.03 mg/dL (ref 0.40–1.20)
GFR: 51.31 mL/min — ABNORMAL LOW (ref >60.00)
Glucose, Bld: 73 mg/dL (ref 70–99)
Potassium: 4.4 meq/L (ref 3.5–5.1)
Sodium: 140 meq/L (ref 135–145)
Total Bilirubin: 0.6 mg/dL (ref 0.2–1.2)
Total Protein: 7.4 g/dL (ref 6.0–8.3)

## 2023-10-13 LAB — CBC WITH DIFFERENTIAL/PLATELET
Basophils Absolute: 0.1 10*3/uL (ref 0.0–0.1)
Basophils Relative: 1.1 % (ref 0.0–3.0)
Eosinophils Absolute: 0.1 10*3/uL (ref 0.0–0.7)
Eosinophils Relative: 1.7 % (ref 0.0–5.0)
HCT: 34 % — ABNORMAL LOW (ref 36.0–46.0)
Hemoglobin: 11.3 g/dL — ABNORMAL LOW (ref 12.0–15.0)
Lymphocytes Relative: 23.5 % (ref 12.0–46.0)
Lymphs Abs: 1.2 10*3/uL (ref 0.7–4.0)
MCHC: 33.1 g/dL (ref 30.0–36.0)
MCV: 86.6 fl (ref 78.0–100.0)
Monocytes Absolute: 0.4 10*3/uL (ref 0.1–1.0)
Monocytes Relative: 7.7 % (ref 3.0–12.0)
Neutro Abs: 3.5 10*3/uL (ref 1.4–7.7)
Neutrophils Relative %: 66 % (ref 43.0–77.0)
Platelets: 360 10*3/uL (ref 150.0–400.0)
RBC: 3.93 Mil/uL (ref 3.87–5.11)
RDW: 15.1 % (ref 11.5–15.5)
WBC: 5.3 10*3/uL (ref 4.0–10.5)

## 2023-10-13 LAB — LIPID PANEL
Cholesterol: 203 mg/dL — ABNORMAL HIGH (ref 0–200)
HDL: 77 mg/dL (ref >39.00)
LDL Cholesterol: 112 mg/dL — ABNORMAL HIGH (ref 0–99)
NonHDL: 126.42
Total CHOL/HDL Ratio: 3
Triglycerides: 72 mg/dL (ref 0.0–149.0)
VLDL: 14.4 mg/dL (ref 0.0–40.0)

## 2023-10-13 LAB — VITAMIN D 25 HYDROXY (VIT D DEFICIENCY, FRACTURES): VITD: 29.86 ng/mL — ABNORMAL LOW (ref 30.00–100.00)

## 2023-10-13 LAB — TSH: TSH: 0.51 u[IU]/mL (ref 0.35–5.50)

## 2023-10-13 MED ORDER — VITAMIN D (ERGOCALCIFEROL) 1.25 MG (50000 UNIT) PO CAPS
50000.0000 [IU] | ORAL_CAPSULE | ORAL | 0 refills | Status: AC
Start: 2023-10-13 — End: 2023-12-30

## 2023-10-15 ENCOUNTER — Encounter (HOSPITAL_COMMUNITY): Payer: Self-pay

## 2023-10-15 ENCOUNTER — Ambulatory Visit: Payer: Self-pay

## 2023-10-15 ENCOUNTER — Ambulatory Visit (HOSPITAL_COMMUNITY): Admission: EM | Admit: 2023-10-15 | Discharge: 2023-10-15 | Disposition: A | Discharge status: Home / Self Care

## 2023-10-15 DIAGNOSIS — L03116 Cellulitis of left lower limb: Secondary | ICD-10-CM

## 2023-10-15 DIAGNOSIS — Z760 Encounter for issue of repeat prescription: Secondary | ICD-10-CM | POA: Diagnosis not present

## 2023-10-15 MED ORDER — HYDROCHLOROTHIAZIDE 12.5 MG PO TABS
12.5000 mg | ORAL_TABLET | Freq: Every day | ORAL | 0 refills | Status: AC
Start: 2023-10-15 — End: ?

## 2023-10-15 MED ORDER — SULFAMETHOXAZOLE-TRIMETHOPRIM 800-160 MG PO TABS
1.0000 | ORAL_TABLET | Freq: Two times a day (BID) | ORAL | 0 refills | Status: AC
Start: 2023-10-15 — End: 2023-10-22
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
## 2023-10-15 NOTE — ED Provider Notes (Signed)
 [facility]    CSN: [id number] Arrival date & time: 10/15/23  1541      History   Chief Complaint Chief Complaint  Patient presents with   Wound Check    HPI Tiffany Reed is a 83 y.o. female.   HPI Patient is an 83 year old female who presents to the urgent care today with concerns of possible cellulitis and needing a medication refill.  She reports injuring the right shin about 2 weeks ago.  She injured it while she was gardening.  She saw her primary care provider who was concerned for cellulitis and put her on Keflex .  She reports an allergy to penicillin and has noticed a small rash on her arm and hand and was concerned that it may have been from the Keflex .  She reports that her allergy to amoxicillin was a rash when she was younger.  She has been changing the bandage on the leg every several days and has noticed some drainage on the bandage.  She reports that the redness and swelling and tenderness has been worsening despite being on the Keflex .  She has been taking Keflex  for about 4 days now.  She denies any fever, pain out of proportion, loss of sensation, or other concerns at this time.  Lastly, she reports recently switching to a lower dosage of her blood pressure medicine but has not gotten a refill yet from her primary care provider and needs to bridge the gap. Past Medical History:  Diagnosis Date   Breast cancer [facility]) 2005   right, s/p xrt   Carotid bruit    Dysrhythmia    had halter monitor 4/13-all ok for palpatations   Fluttering heart    and racing over the past several years with lightheadedness   H/O urinary incontinence    History of cardiac arrhythmia    Hypertension    Hypothyroidism    Mitral valve prolapse    Osteopenia    Personal history of radiation therapy     Patient Active Problem List   Diagnosis Date Noted   Vitamin D  deficiency 10/13/2023   Blunt trauma 05/04/2023   Hyperlipidemia 11/06/2021   Hypogonadism, ovarian 12/18/2014    Osteoporosis 12/25/2011   Palpitations 08/17/2011   Hypothyroidism 09/27/2007   Essential hypertension 09/27/2007    Past Surgical History:  Procedure Laterality Date   BREAST LUMPECTOMY  04/21/2003   right   EAR CYST EXCISION Left 06/15/2012   Procedure: Left scaphoid cyst excision with distal radius graft;  Surgeon: Arley JONELLE Curia, MD;  Location: [facility];  Service: Orthopedics;  Laterality: Left;  EXCISION CYST LEFT SCAPHOID DISTAL RADIUS GRAFT   EXCISION MORTON'S NEUROMA  04/21/1999   left foot   EYE SURGERY     GANGLION CYST EXCISION  04/21/2003   left thumb   HAMMER TOE SURGERY  04/20/2010   right 5th toe   HEMORROIDECTOMY  04/21/1971   HYSTEROSCOPY  2005,2002   NEUROPLASTY / TRANSPOSITION MEDIAN NERVE AT CARPAL TUNNEL BILATERAL  04/20/2000   PILONIDAL CYST / SINUS EXCISION  04/20/1966   plantar fibroma     4th toe, right foot   THYROID  LOBECTOMY  04/20/2002    OB History   No obstetric history on file.      Home Medications    Prior to Admission medications   Medication Sig Start Date End Date Taking? Authorizing Provider  atorvastatin  (LIPITOR) 20 MG tablet TAKE 1 TABLET BY MOUTH EVERY DAY 07/27/23  Yes Theophilus  Delma Tully GRADE, MD  cephALEXin  (KEFLEX ) 500 MG capsule Take 1 capsule (500 mg total) by mouth 2 (two) times daily for 10 days. 10/12/23 10/22/23 Yes Theophilus Delma Tully GRADE, MD  hydrochlorothiazide  (HYDRODIURIL ) 12.5 MG tablet Take 1 tablet (12.5 mg total) by mouth daily. 10/15/23  Yes Melonie Locus, PA-C  hydrochlorothiazide  (MICROZIDE ) 12.5 MG capsule TAKE 1 CAPSULE BY MOUTH EVERY DAY 08/10/23  Yes Theophilus Delma, Tully GRADE, MD  ibandronate  (BONIVA ) 150 MG tablet TAKE 1 TABLET (150 MG TOTAL) BY MOUTH EVERY 30 (THIRTY) DAYS. TAKE IN THE MORNING WITH A FULL GLASS OF WATER, ON AN EMPTY STOMACH, AND DO NOT TAKE ANYTHING ELSE BY MOUTH OR LIE DOWN FOR THE NEXT 30 MIN. 02/01/23  Yes Theophilus Delma, Tully GRADE, MD  levothyroxine  (SYNTHROID ) 75  MCG tablet TAKE 1 TABLET BY MOUTH EVERY DAY BEFORE BREAKFAST 09/07/23  Yes Theophilus Delma, Tully GRADE, MD  sulfamethoxazole-trimethoprim (BACTRIM DS) 800-160 MG tablet Take 1 tablet by mouth 2 (two) times daily for 7 days. 10/15/23 10/22/23 Yes Melonie Locus, PA-C  Vitamin D , Ergocalciferol , (DRISDOL ) 1.25 MG (50000 UNIT) CAPS capsule Take 1 capsule (50,000 Units total) by mouth every 7 (seven) days for 12 doses. 10/13/23 12/30/23 Yes Theophilus Delma Tully GRADE, MD    Family History Family History  Problem Relation Age of Onset   Colon cancer Neg Hx    Stomach cancer Neg Hx     Social History Social History   Tobacco Use   Smoking status: Never   Smokeless tobacco: Never  Substance Use Topics   Alcohol use: Yes    Comment: rare   Drug use: No     Allergies   Penicillins and Sorbitol   Review of Systems Review of Systems See HPI for relevant ROS.  Physical Exam Triage Vital Signs ED Triage Vitals [10/15/23 1647]  Encounter Vitals Group     BP 121/70     Girls Systolic BP Percentile      Girls Diastolic BP Percentile      Boys Systolic BP Percentile      Boys Diastolic BP Percentile      Pulse Rate 71     Resp 18     Temp 98.3 F (36.8 C)     Temp Source Oral     SpO2 96 %     Weight      Height      Head Circumference      Peak Flow      Pain Score      Pain Loc      Pain Education      Exclude from Growth Chart    No data found.  Updated Vital Signs BP 121/70 (BP Location: Left Arm)   Pulse 71   Temp 98.3 F (36.8 C) (Oral)   Resp 18   SpO2 96%   Visual Acuity Right Eye Distance:   Left Eye Distance:   Bilateral Distance:    Right Eye Near:   Left Eye Near:    Bilateral Near:     Physical Exam General: Alert and oriented, well-developed/well-nourished, calm, cooperative, no acute distress HEENT: Normocephalic atraumatic, moist mucous membranes, no scleral icterus, trachea midline Lungs: Speaking full sentences, non-labored respirations, no  distress Heart: Regular rate and rhythm Abdomen:  Soft, nondistended Musculoskeletal: Moves all extremities well Pulses: 2+ pedal bilaterally Neurologic: Awake, A&O x4, gait normal Integumentary: Warm, dry, normal for ethnicity, erythema with tenderness and warmth to the touch of the left lower leg Psychiatric:  Appropriate mood & affect  UC Treatments / Results  Labs (all labs ordered are listed, but only abnormal results are displayed) Labs Reviewed - No data to display  EKG   Radiology No results found.  Procedures Procedures (including critical care time)  Medications Ordered in UC Medications - No data to display  Initial Impression / Assessment and Plan / UC Course  I have reviewed the triage vital signs and the nursing notes.  Pertinent labs & imaging results that were available during my care of the patient were reviewed by me and considered in my medical decision making (see chart for details).   Patient presents with erythema, edema, tenderness, and warmth of the left lower leg.  Differential Diagnosis: Cellulitis, abscess, necrotizing soft tissue infection, DVT, erysipelas, vascular disease, including other diagnoses.  Rationale: This patient presents with initial presentation of local erythema, warmth, swelling concerning for cellulitis. Sensitivity/pain to light touch around the erythematous area. No lymphangitic spread visible and no fluid pockets or fluctuance concerning for abscess noted. Low concern for osteomyelitis or DVT. No immune compromise, bullae, pain out of proportion, or rapid progression concerning for necrotizing fasciitis. Additionally, patient is afebrile and nontoxic-appearing.  Prescribed patient Bactrim as the Keflex  has not been helping with her symptoms and she reports some purulent drainage from the wound. Strict return precautions to the urgent care or emergency department were discussed including loss of sensation, fever, purulent drainage from  the wound, redness, swelling, pain out of proportion, or if patient has any other concerns.  Patient should follow-up with their PCP in the next several days.  Lastly, refilled patient's HCTZ for a short duration until she can follow-up with a primary care provider.  Disposition: Stable to discharge home.  All questions answered to the best of this examiner's ability. Advised to f/u with PCP for further eval and/or reassessment. Patient agrees to plan.  An appropriate evaluation has been performed, and in my medical judgment there is currently no evidence of an immediate life-threatening or surgical condition. Discharge is therefore indicated at this time.  This document was created using the aid of voice recognition Scientist, clinical (histocompatibility and immunogenetics).  Final Clinical Impressions(s) / UC Diagnoses   Final diagnoses:  Cellulitis of left lower extremity  Medication refill     Discharge Instructions      We have sent in an antibiotic to cover for possible cellulitis.  We have also sent in a short duration of your blood pressure medication.  We recommend following up with your primary care provider in the next several days to ensure that symptoms are improving.  Please return to go to emergency department if you have any pain out of proportion, fever, significant worsening of symptoms, or if you have any other concerns.    ED Prescriptions     Medication Sig Dispense Auth. Provider   sulfamethoxazole-trimethoprim (BACTRIM DS) 800-160 MG tablet Take 1 tablet by mouth 2 (two) times daily for 7 days. 14 tablet Melonie Locus, PA-C   hydrochlorothiazide  (HYDRODIURIL ) 12.5 MG tablet Take 1 tablet (12.5 mg total) by mouth daily. 14 tablet Melonie Locus, PA-C      PDMP not reviewed this encounter.   Melonie Locus, PA-C 10/15/23 Tiffany Reed

## 2023-10-15 NOTE — Discharge Instructions (Signed)
 We have sent in an antibiotic to cover for possible cellulitis.  We have also sent in a short duration of your blood pressure medication.  We recommend following up with your primary care provider in the next several days to ensure that symptoms are improving.  Please return to go to emergency department if you have any pain out of proportion, fever, significant worsening of symptoms, or if you have any other concerns.

## 2023-10-15 NOTE — ED Triage Notes (Signed)
 Patient presents to the office for R-leg wound check. Patient states she injured her shin 2 weeks ago.  Patient states they prescribed keflex  however she is allergic to PCN.  Patient now has a rash on bilateral arms and fingers.

## 2023-10-26 DIAGNOSIS — M6281 Muscle weakness (generalized): Secondary | ICD-10-CM | POA: Diagnosis not present

## 2023-10-26 DIAGNOSIS — R262 Difficulty in walking, not elsewhere classified: Secondary | ICD-10-CM | POA: Diagnosis not present

## 2023-10-26 DIAGNOSIS — M542 Cervicalgia: Secondary | ICD-10-CM | POA: Diagnosis not present

## 2023-10-26 DIAGNOSIS — R293 Abnormal posture: Secondary | ICD-10-CM | POA: Diagnosis not present

## 2023-10-28 ENCOUNTER — Encounter: Payer: Self-pay | Admitting: Internal Medicine

## 2023-10-28 ENCOUNTER — Other Ambulatory Visit: Payer: Self-pay | Admitting: Internal Medicine

## 2023-10-28 DIAGNOSIS — I10 Essential (primary) hypertension: Secondary | ICD-10-CM

## 2023-10-28 MED ORDER — HYDROCHLOROTHIAZIDE 12.5 MG PO CAPS: 12.5000 mg | ORAL_CAPSULE | Freq: Every day | ORAL | 1 refills | Status: AC

## 2023-10-28 NOTE — Telephone Encounter (Signed)
 Copied from CRM [id number]. Topic: Clinical - Medication Refill >> Oct 28, 2023 12:06 PM Shardie S wrote: Medication:  hydrochlorothiazide  (MICROZIDE ) 12.5 MG capsule  Has the patient contacted their pharmacy? Yes (Agent: If no, request that the patient contact the pharmacy for the refill. If patient does not wish to contact the pharmacy document the reason why and proceed with request.) (Agent: If yes, when and what did the pharmacy advise?)  This is the patient's preferred pharmacy:  [id number] - [place], [place] - [address] AT [facility] [address] [place] [address] Phone: [phone] Fax: [phone]    Is this the correct pharmacy for this prescription? Yes If no, delete pharmacy and type the correct one.   Has the prescription been filled recently? No  Is the patient out of the medication? Yes  Has the patient been seen for an appointment in the last year OR does the patient have an upcoming appointment? Yes  Can we respond through MyChart? No  Agent: Please be advised that Rx refills may take up to 3 business days. We ask that you follow-up with your pharmacy.

## 2023-10-29 DIAGNOSIS — M6281 Muscle weakness (generalized): Secondary | ICD-10-CM | POA: Diagnosis not present

## 2023-10-29 DIAGNOSIS — R262 Difficulty in walking, not elsewhere classified: Secondary | ICD-10-CM | POA: Diagnosis not present

## 2023-10-29 DIAGNOSIS — R293 Abnormal posture: Secondary | ICD-10-CM | POA: Diagnosis not present

## 2023-10-29 DIAGNOSIS — M542 Cervicalgia: Secondary | ICD-10-CM | POA: Diagnosis not present

## 2023-11-02 DIAGNOSIS — M6281 Muscle weakness (generalized): Secondary | ICD-10-CM | POA: Diagnosis not present

## 2023-11-02 DIAGNOSIS — M542 Cervicalgia: Secondary | ICD-10-CM | POA: Diagnosis not present

## 2023-11-02 DIAGNOSIS — R262 Difficulty in walking, not elsewhere classified: Secondary | ICD-10-CM | POA: Diagnosis not present

## 2023-11-02 DIAGNOSIS — R293 Abnormal posture: Secondary | ICD-10-CM | POA: Diagnosis not present

## 2023-11-05 DIAGNOSIS — R293 Abnormal posture: Secondary | ICD-10-CM | POA: Diagnosis not present

## 2023-11-05 DIAGNOSIS — M542 Cervicalgia: Secondary | ICD-10-CM | POA: Diagnosis not present

## 2023-11-05 DIAGNOSIS — M6281 Muscle weakness (generalized): Secondary | ICD-10-CM | POA: Diagnosis not present

## 2023-11-05 DIAGNOSIS — R262 Difficulty in walking, not elsewhere classified: Secondary | ICD-10-CM | POA: Diagnosis not present

## 2023-11-09 DIAGNOSIS — R262 Difficulty in walking, not elsewhere classified: Secondary | ICD-10-CM | POA: Diagnosis not present

## 2023-11-09 DIAGNOSIS — M542 Cervicalgia: Secondary | ICD-10-CM | POA: Diagnosis not present

## 2023-11-09 DIAGNOSIS — M6281 Muscle weakness (generalized): Secondary | ICD-10-CM | POA: Diagnosis not present

## 2023-11-09 DIAGNOSIS — R293 Abnormal posture: Secondary | ICD-10-CM | POA: Diagnosis not present

## 2023-11-12 DIAGNOSIS — M542 Cervicalgia: Secondary | ICD-10-CM | POA: Diagnosis not present

## 2023-11-12 DIAGNOSIS — R262 Difficulty in walking, not elsewhere classified: Secondary | ICD-10-CM | POA: Diagnosis not present

## 2023-11-12 DIAGNOSIS — R293 Abnormal posture: Secondary | ICD-10-CM | POA: Diagnosis not present

## 2023-11-12 DIAGNOSIS — M6281 Muscle weakness (generalized): Secondary | ICD-10-CM | POA: Diagnosis not present

## 2023-11-15 DIAGNOSIS — M6281 Muscle weakness (generalized): Secondary | ICD-10-CM | POA: Diagnosis not present

## 2023-11-15 DIAGNOSIS — R293 Abnormal posture: Secondary | ICD-10-CM | POA: Diagnosis not present

## 2023-11-15 DIAGNOSIS — M542 Cervicalgia: Secondary | ICD-10-CM | POA: Diagnosis not present

## 2023-11-15 DIAGNOSIS — R262 Difficulty in walking, not elsewhere classified: Secondary | ICD-10-CM | POA: Diagnosis not present

## 2023-11-17 DIAGNOSIS — M6281 Muscle weakness (generalized): Secondary | ICD-10-CM | POA: Diagnosis not present

## 2023-11-17 DIAGNOSIS — M542 Cervicalgia: Secondary | ICD-10-CM | POA: Diagnosis not present

## 2023-11-17 DIAGNOSIS — R293 Abnormal posture: Secondary | ICD-10-CM | POA: Diagnosis not present

## 2023-11-17 DIAGNOSIS — R262 Difficulty in walking, not elsewhere classified: Secondary | ICD-10-CM | POA: Diagnosis not present

## 2023-11-19 ENCOUNTER — Other Ambulatory Visit: Payer: Self-pay | Admitting: Internal Medicine

## 2023-11-19 DIAGNOSIS — Z1231 Encounter for screening mammogram for malignant neoplasm of breast: Secondary | ICD-10-CM

## 2023-12-07 ENCOUNTER — Ambulatory Visit
Admission: RE | Admit: 2023-12-07 | Discharge: 2023-12-07 | Disposition: A | Discharge status: Home / Self Care | Source: Ambulatory Visit | Attending: Internal Medicine | Admitting: Internal Medicine

## 2023-12-07 DIAGNOSIS — R293 Abnormal posture: Secondary | ICD-10-CM | POA: Diagnosis not present

## 2023-12-07 DIAGNOSIS — M6281 Muscle weakness (generalized): Secondary | ICD-10-CM | POA: Diagnosis not present

## 2023-12-07 DIAGNOSIS — Z1231 Encounter for screening mammogram for malignant neoplasm of breast: Secondary | ICD-10-CM | POA: Diagnosis not present

## 2023-12-07 DIAGNOSIS — R262 Difficulty in walking, not elsewhere classified: Secondary | ICD-10-CM | POA: Diagnosis not present

## 2023-12-07 DIAGNOSIS — M542 Cervicalgia: Secondary | ICD-10-CM | POA: Diagnosis not present

## 2023-12-10 DIAGNOSIS — M6281 Muscle weakness (generalized): Secondary | ICD-10-CM | POA: Diagnosis not present

## 2023-12-10 DIAGNOSIS — M542 Cervicalgia: Secondary | ICD-10-CM | POA: Diagnosis not present

## 2023-12-10 DIAGNOSIS — R262 Difficulty in walking, not elsewhere classified: Secondary | ICD-10-CM | POA: Diagnosis not present

## 2023-12-10 DIAGNOSIS — R293 Abnormal posture: Secondary | ICD-10-CM | POA: Diagnosis not present

## 2023-12-13 DIAGNOSIS — M1712 Unilateral primary osteoarthritis, left knee: Secondary | ICD-10-CM | POA: Diagnosis not present

## 2023-12-13 DIAGNOSIS — M170 Bilateral primary osteoarthritis of knee: Secondary | ICD-10-CM | POA: Diagnosis not present

## 2023-12-13 DIAGNOSIS — M1711 Unilateral primary osteoarthritis, right knee: Secondary | ICD-10-CM | POA: Diagnosis not present

## 2023-12-21 DIAGNOSIS — S80812S Abrasion, left lower leg, sequela: Secondary | ICD-10-CM | POA: Diagnosis not present

## 2024-01-09 ENCOUNTER — Other Ambulatory Visit: Payer: Self-pay | Admitting: Internal Medicine

## 2024-01-09 DIAGNOSIS — E559 Vitamin D deficiency, unspecified: Secondary | ICD-10-CM

## 2024-01-19 ENCOUNTER — Other Ambulatory Visit: Payer: Self-pay | Admitting: Internal Medicine

## 2024-01-19 DIAGNOSIS — E559 Vitamin D deficiency, unspecified: Secondary | ICD-10-CM

## 2024-01-21 ENCOUNTER — Other Ambulatory Visit: Payer: Self-pay | Admitting: Internal Medicine

## 2024-01-21 DIAGNOSIS — E782 Mixed hyperlipidemia: Secondary | ICD-10-CM

## 2024-01-26 DIAGNOSIS — D485 Neoplasm of uncertain behavior of skin: Secondary | ICD-10-CM | POA: Diagnosis not present

## 2024-01-26 DIAGNOSIS — L821 Other seborrheic keratosis: Secondary | ICD-10-CM | POA: Diagnosis not present

## 2024-01-26 DIAGNOSIS — D225 Melanocytic nevi of trunk: Secondary | ICD-10-CM | POA: Diagnosis not present

## 2024-01-27 DIAGNOSIS — Z23 Encounter for immunization: Secondary | ICD-10-CM | POA: Diagnosis not present

## 2024-02-03 DIAGNOSIS — M65331 Trigger finger, right middle finger: Secondary | ICD-10-CM | POA: Diagnosis not present

## 2024-02-12 ENCOUNTER — Other Ambulatory Visit: Payer: Self-pay | Admitting: Family Medicine

## 2024-02-12 ENCOUNTER — Other Ambulatory Visit: Payer: Self-pay | Admitting: Internal Medicine

## 2024-02-12 DIAGNOSIS — E559 Vitamin D deficiency, unspecified: Secondary | ICD-10-CM

## 2024-02-12 DIAGNOSIS — E039 Hypothyroidism, unspecified: Secondary | ICD-10-CM

## 2024-03-08 DIAGNOSIS — H353132 Nonexudative age-related macular degeneration, bilateral, intermediate dry stage: Secondary | ICD-10-CM | POA: Diagnosis not present

## 2024-03-08 DIAGNOSIS — Z9849 Cataract extraction status, unspecified eye: Secondary | ICD-10-CM | POA: Diagnosis not present

## 2024-03-08 DIAGNOSIS — H52223 Regular astigmatism, bilateral: Secondary | ICD-10-CM | POA: Diagnosis not present

## 2024-03-08 DIAGNOSIS — H43393 Other vitreous opacities, bilateral: Secondary | ICD-10-CM | POA: Diagnosis not present

## 2024-03-08 DIAGNOSIS — Z961 Presence of intraocular lens: Secondary | ICD-10-CM | POA: Diagnosis not present

## 2024-03-08 DIAGNOSIS — H5211 Myopia, right eye: Secondary | ICD-10-CM | POA: Diagnosis not present

## 2024-03-08 DIAGNOSIS — H3530 Unspecified macular degeneration: Secondary | ICD-10-CM | POA: Diagnosis not present

## 2024-03-21 DIAGNOSIS — M170 Bilateral primary osteoarthritis of knee: Secondary | ICD-10-CM | POA: Diagnosis not present

## 2024-03-22 DIAGNOSIS — M65331 Trigger finger, right middle finger: Secondary | ICD-10-CM | POA: Diagnosis not present

## 2024-03-22 DIAGNOSIS — M79641 Pain in right hand: Secondary | ICD-10-CM | POA: Diagnosis not present

## 2024-04-05 ENCOUNTER — Ambulatory Visit: Admitting: Internal Medicine

## 2024-04-05 ENCOUNTER — Encounter: Payer: Self-pay | Admitting: Internal Medicine

## 2024-04-05 VITALS — BP 150/79 | HR 58 | Temp 97.5°F | Wt 131.6 lb

## 2024-04-05 DIAGNOSIS — J219 Acute bronchiolitis, unspecified: Secondary | ICD-10-CM | POA: Diagnosis not present

## 2024-04-05 DIAGNOSIS — I10 Essential (primary) hypertension: Secondary | ICD-10-CM

## 2024-04-05 MED ORDER — AZITHROMYCIN 250 MG PO TABS: ORAL_TABLET | ORAL | 0 refills | Status: AC

## 2024-04-05 NOTE — Progress Notes (Signed)
 Established Patient Office Visit     CC/Reason for Visit: Cough, congestion  HPI: Tiffany Reed is a 83 y.o. female who is coming in today for the above mentioned reasons. Past Medical History is significant for: Hypertension and hyperlipidemia.  For 2-1/2 weeks she has been having significant chest congestion, cough productive of thick, yellow sputum.  She initially had upper respiratory symptoms but these have improved.  No fever or bodyaches.  Positive sick contact at home.   Past Medical/Surgical History: Past Medical History:  Diagnosis Date   Breast cancer ([facility]) 2005   right, s/p xrt   Carotid bruit    Dysrhythmia    had halter monitor 4/13-all ok for palpatations   Fluttering heart    and racing over the past several years with lightheadedness   H/O urinary incontinence    History of cardiac arrhythmia    Hypertension    Hypothyroidism    Mitral valve prolapse    Osteopenia    Personal history of radiation therapy     Past Surgical History:  Procedure Laterality Date   BREAST LUMPECTOMY  04/21/2003   right   EAR CYST EXCISION Left 06/15/2012   Procedure: Left scaphoid cyst excision with distal radius graft;  Surgeon: Arley JONELLE Curia, MD;  Location: [facility];  Service: Orthopedics;  Laterality: Left;  EXCISION CYST LEFT SCAPHOID DISTAL RADIUS GRAFT   EXCISION MORTON'S NEUROMA  04/21/1999   left foot   EYE SURGERY     GANGLION CYST EXCISION  04/21/2003   left thumb   HAMMER TOE SURGERY  04/20/2010   right 5th toe   HEMORROIDECTOMY  04/21/1971   HYSTEROSCOPY  2005,2002   NEUROPLASTY / TRANSPOSITION MEDIAN NERVE AT CARPAL TUNNEL BILATERAL  04/20/2000   PILONIDAL CYST / SINUS EXCISION  04/20/1966   plantar fibroma     4th toe, right foot   THYROID  LOBECTOMY  04/20/2002    Social History:  reports that she has never smoked. She has never used smokeless tobacco. She reports current alcohol use. She reports that she does not use  drugs.  Allergies: Allergies[1]  Family History:  Family History  Problem Relation Age of Onset   Colon cancer Neg Hx    Stomach cancer Neg Hx     Current Medications[2]  Review of Systems:  Negative unless indicated in HPI.   Physical Exam: Vitals:   04/05/24 1430 04/05/24 1434  BP: (!) 160/90 (!) 150/79  Pulse: (!) 58   Temp: (!) 97.5 F (36.4 C)   TempSrc: Oral   SpO2: 98%   Weight: 131 lb 9.6 oz (59.7 kg)     Body mass index is 24.07 kg/m.   Physical Exam Vitals reviewed.  Constitutional:      Appearance: Normal appearance.  HENT:     Right Ear: Tympanic membrane, ear canal and external ear normal.     Left Ear: Tympanic membrane, ear canal and external ear normal.     Mouth/Throat:     Mouth: Mucous membranes are moist.     Pharynx: Posterior oropharyngeal erythema present.  Eyes:     Conjunctiva/sclera: Conjunctivae normal.     Pupils: Pupils are equal, round, and reactive to light.  Cardiovascular:     Rate and Rhythm: Normal rate and regular rhythm.  Pulmonary:     Effort: Pulmonary effort is normal.     Breath sounds: Rhonchi present.  Neurological:     Mental Status: She is alert.  Impression and Plan:  Acute bronchiolitis due to unspecified organism -     Azithromycin ; Take 2 tablets on day 1, then 1 tablet daily on days 2 through 5  Dispense: 6 tablet; Refill: 0  Essential hypertension   - Likely has acute bronchitis given lung auscultation and duration of symptoms.  Treat with azithromycin .  Also advise guaifenesin . - Blood pressure elevated today on 2 separate visits.  I have asked her to do ambulatory blood pressure measurements and return in 4 to 6 weeks for follow-up.  Time spent:31 minutes reviewing chart, interviewing and examining patient and formulating plan of care.     Tully Theophilus Andrews, MD [facility]     [1]  Allergies Allergen Reactions   Penicillins Rash   Sorbitol     Gas,  diarrhea  [2]  Current Outpatient Medications:    atorvastatin  (LIPITOR) 20 MG tablet, TAKE 1 TABLET BY MOUTH EVERY DAY, Disp: 90 tablet, Rfl: 1   azithromycin  (ZITHROMAX ) 250 MG tablet, Take 2 tablets on day 1, then 1 tablet daily on days 2 through 5, Disp: 6 tablet, Rfl: 0   hydrochlorothiazide  (HYDRODIURIL ) 12.5 MG tablet, Take 1 tablet (12.5 mg total) by mouth daily., Disp: 14 tablet, Rfl: 0   hydrochlorothiazide  (MICROZIDE ) 12.5 MG capsule, Take 1 capsule (12.5 mg total) by mouth daily., Disp: 90 capsule, Rfl: 1   ibandronate  (BONIVA ) 150 MG tablet, TAKE 1 TABLET (150 MG TOTAL) BY MOUTH EVERY 30 (THIRTY) DAYS. TAKE IN THE MORNING WITH A FULL GLASS OF WATER, ON AN EMPTY STOMACH, AND DO NOT TAKE ANYTHING ELSE BY MOUTH OR LIE DOWN FOR THE NEXT 30 MIN., Disp: 3 tablet, Rfl: 59   levothyroxine  (SYNTHROID ) 75 MCG tablet, TAKE 1 TABLET BY MOUTH EVERY DAY BEFORE BREAKFAST, Disp: 90 tablet, Rfl: 0

## 2024-04-21 ENCOUNTER — Other Ambulatory Visit: Payer: Self-pay | Admitting: Internal Medicine

## 2024-04-21 DIAGNOSIS — M8580 Other specified disorders of bone density and structure, unspecified site: Secondary | ICD-10-CM

## 2024-05-17 ENCOUNTER — Other Ambulatory Visit: Payer: Self-pay | Admitting: Internal Medicine

## 2024-05-17 DIAGNOSIS — E039 Hypothyroidism, unspecified: Secondary | ICD-10-CM
